# Patient Record
Sex: Female | Born: 1955 | Race: White | Hispanic: No | Marital: Married | Smoking: Never smoker
Health system: Southern US, Community
[De-identification: ages and names within clinical notes are randomized; demographics above are authoritative.]

## PROBLEM LIST (undated history)

## (undated) DIAGNOSIS — G4730 Sleep apnea, unspecified: Secondary | ICD-10-CM

## (undated) DIAGNOSIS — Z9889 Other specified postprocedural states: Secondary | ICD-10-CM

## (undated) DIAGNOSIS — T8859XA Other complications of anesthesia, initial encounter: Secondary | ICD-10-CM

## (undated) DIAGNOSIS — E039 Hypothyroidism, unspecified: Secondary | ICD-10-CM

## (undated) DIAGNOSIS — K5900 Constipation, unspecified: Secondary | ICD-10-CM

## (undated) DIAGNOSIS — Z8719 Personal history of other diseases of the digestive system: Secondary | ICD-10-CM

## (undated) DIAGNOSIS — K219 Gastro-esophageal reflux disease without esophagitis: Secondary | ICD-10-CM

## (undated) DIAGNOSIS — F419 Anxiety disorder, unspecified: Secondary | ICD-10-CM

## (undated) DIAGNOSIS — J189 Pneumonia, unspecified organism: Secondary | ICD-10-CM

## (undated) DIAGNOSIS — J45909 Unspecified asthma, uncomplicated: Secondary | ICD-10-CM

## (undated) DIAGNOSIS — R569 Unspecified convulsions: Secondary | ICD-10-CM

## (undated) DIAGNOSIS — R112 Nausea with vomiting, unspecified: Secondary | ICD-10-CM

## (undated) DIAGNOSIS — F32A Depression, unspecified: Secondary | ICD-10-CM

## (undated) DIAGNOSIS — J449 Chronic obstructive pulmonary disease, unspecified: Secondary | ICD-10-CM

## (undated) DIAGNOSIS — F329 Major depressive disorder, single episode, unspecified: Secondary | ICD-10-CM

## (undated) DIAGNOSIS — M797 Fibromyalgia: Secondary | ICD-10-CM

## (undated) DIAGNOSIS — T4145XA Adverse effect of unspecified anesthetic, initial encounter: Secondary | ICD-10-CM

## (undated) DIAGNOSIS — R0602 Shortness of breath: Secondary | ICD-10-CM

## (undated) DIAGNOSIS — R51 Headache: Secondary | ICD-10-CM

## (undated) DIAGNOSIS — M1990 Unspecified osteoarthritis, unspecified site: Secondary | ICD-10-CM

## (undated) DIAGNOSIS — K279 Peptic ulcer, site unspecified, unspecified as acute or chronic, without hemorrhage or perforation: Secondary | ICD-10-CM

## (undated) HISTORY — PX: JOINT REPLACEMENT: SHX530

## (undated) HISTORY — PX: TUBAL LIGATION: SHX77

## (undated) HISTORY — PX: VAGINAL HYSTERECTOMY: SUR661

## (undated) HISTORY — PX: BREAST SURGERY: SHX581

## (undated) HISTORY — PX: TONSILLECTOMY: SUR1361

## (undated) HISTORY — PX: COLONOSCOPY: SHX174

## (undated) HISTORY — PX: DILATION AND CURETTAGE OF UTERUS: SHX78

## (undated) HISTORY — PX: BACK SURGERY: SHX140

---

## 2011-02-11 DIAGNOSIS — K219 Gastro-esophageal reflux disease without esophagitis: Secondary | ICD-10-CM | POA: Insufficient documentation

## 2011-02-11 DIAGNOSIS — M797 Fibromyalgia: Secondary | ICD-10-CM | POA: Insufficient documentation

## 2011-02-11 DIAGNOSIS — E039 Hypothyroidism, unspecified: Secondary | ICD-10-CM | POA: Insufficient documentation

## 2011-02-11 DIAGNOSIS — F319 Bipolar disorder, unspecified: Secondary | ICD-10-CM | POA: Insufficient documentation

## 2011-09-24 ENCOUNTER — Other Ambulatory Visit (HOSPITAL_COMMUNITY): Payer: Self-pay | Admitting: Orthopedic Surgery

## 2011-09-24 DIAGNOSIS — M25551 Pain in right hip: Secondary | ICD-10-CM

## 2011-09-30 ENCOUNTER — Encounter (HOSPITAL_COMMUNITY)
Admission: RE | Admit: 2011-09-30 | Discharge: 2011-09-30 | Disposition: A | Discharge status: Home / Self Care | Payer: 59 | Source: Ambulatory Visit | Attending: Orthopedic Surgery | Admitting: Orthopedic Surgery

## 2011-09-30 ENCOUNTER — Encounter (HOSPITAL_COMMUNITY): Payer: Self-pay

## 2011-09-30 DIAGNOSIS — M25551 Pain in right hip: Secondary | ICD-10-CM

## 2011-09-30 DIAGNOSIS — M25559 Pain in unspecified hip: Secondary | ICD-10-CM | POA: Insufficient documentation

## 2011-09-30 MED ORDER — TECHNETIUM TC 99M MEDRONATE IV KIT
25.0000 | PACK | Freq: Once | INTRAVENOUS | Status: AC | PRN
  Administered 2011-09-30: 25 via INTRAVENOUS

## 2012-08-25 DIAGNOSIS — R079 Chest pain, unspecified: Secondary | ICD-10-CM | POA: Insufficient documentation

## 2013-02-18 DIAGNOSIS — G4733 Obstructive sleep apnea (adult) (pediatric): Secondary | ICD-10-CM | POA: Insufficient documentation

## 2013-04-17 DIAGNOSIS — J189 Pneumonia, unspecified organism: Secondary | ICD-10-CM

## 2013-04-17 HISTORY — DX: Pneumonia, unspecified organism: J18.9

## 2013-07-09 ENCOUNTER — Encounter (HOSPITAL_COMMUNITY): Payer: Self-pay | Admitting: Pharmacy Technician

## 2013-07-12 NOTE — Pre-Procedure Instructions (Signed)
Jasmine AweConnie Ostrovsky  07/12/2013   Your procedure is scheduled on:  Wednesday July 14, 2013 at 2:55 PM.  Report [facility] Short Stay Entrance "A"  Admitting at 12:55 PM.  Call this number if you have problems the morning of surgery: [phone]   Remember:   Do not eat food or drink liquids after midnight.   Take these medicines the morning of surgery with A SIP OF WATER: Clonazepam (Klonopin), Duloxetine (Cymbalta), Esomeprazole (Nexium), Estradiol (Estrace), Advair inhaler, Lamotrigine (Lamictal), Levothyroxine (Synthroid), Montelukast (Singulair), gabapentin (NEURONTIN) 300 MG capsule if needed:oxyCODONE-acetaminophen (PERCOCET/ROXICET) 5-325 MG per tablet for moderate pain or severe pain, albuterol (PROVENTIL HFA;VENTOLIN HFA) 108 (90 BASE) MCG/ACT inhaler for wheezing or shortness of breath ( bring inhaler with you on the day of your procedure Stop taking Aspirin, MULTIVITAMIN WITH MINERALS and herbal medications. Do not take any NSAIDs ie: Ibuprofen, Advil, Naproxen or any medication containing Aspirin.  Do not wear jewelry, make-up or nail polish.  Do not wear lotions, powders, or perfumes. You may wear deodorant.  Do not shave 48 hours prior to surgery.   Do not bring valuables to the hospital.  [facility] is not responsible for any belongings or valuables.               Contacts, dentures or bridgework may not be worn into surgery.  Leave suitcase in the car. After surgery it may be brought to your room.  For patients admitted to the hospital, discharge time is determined by your treatment team.               Patients discharged the day of surgery will not be allowed to drive home.  Name and phone number of your driver: Family/Friend  Special Instructions: Shower tonight and the morning of your surgery using CHG soap.  Use special wash - you have one bottle of CHG for all showers.  You should use approximately 1/3 of the bottle for each shower.   Please read over the following fact  sheets that you were given: Pain Booklet, Coughing and Deep Breathing, MRSA Information and Surgical Site Infection Prevention  BRING BRACE WITH YOU ON DAY OF PROCEDURE

## 2013-07-12 NOTE — H&P (Signed)
History of Present Illness The patient is a 58 year old female who presents to the practice today for a transition into care. The patient is transitioning into care from another physician Maria Luz, MD) .  Additional reason for visit:  Follow-up backis described as the following: The patient is being followed for their back pain. They are now patient is here to discuss Spinal Cord Stimulator Placement out. Symptoms reported today include: aching and throbbing. The patient states that they are doing well. Current treatment includes: activity modification, pain medications and happy with the trial results. . The following medication has been used for pain control: Hydrocodone. The patient reports their current pain level to be 0 / 10. The patient presents today following 05/21/2013 SCS trial procedure with Dr.Ramos.    Subjective Transcription  Maria Ford is a very pleasant young woman who has had chronic debilitating back, buttock, and bilateral leg pain. She had a previous lumbar surgery about 5-6 years ago. She also had a total hip replacement done, but unfortunately, she continues to have significant back, buttock, and bilateral leg pain. She describes dysesthesias, numbness, and tingling.    I last saw her in 2011 and she was status post her lumbar decompression and discectomy at L5-S1. At that point, she continued to have a poor quality of life. I saw her again in September 2014 and at that point recommended evaluation for a potential spinal cord stimulator. She subsequently saw Dr. Ethelene Hal who did the trial. The patient noted 100% relief of her back, buttock, and leg pain during the trial. As a result, she is now back in to see me for permanent implantation.    Allergies Sulfa Drugs    Social History( Drug/Alcohol Rehab (Previously). no Exercise. Exercises weekly; does running / walking Illicit drug use. yes Living situation. live with spouse Marital status.  married Number of flights of stairs before winded. 2-3 Pain Contract. no Alcohol use. current drinker; drinks wine; only occasionally per week Children. 2 Current work status. disabled Tobacco / smoke exposure. no Drug/Alcohol Rehab (Currently). no Tobacco use. Never smoker. never smoker    Medication History Norco (5-325MG  Tablet, 1 (one) Tablet Oral 1 po tid, Taken starting 05/28/2013) Active. Adderall (5MG  Tablet, Oral) Active. Albuterol Sulfate ((2.5 MG/3ML)0.083% Nebulized Soln, Inhalation) Active. Cymbalta (20MG  Capsule DR Part, Oral) Active. Gabapentin (300MG  Capsule, Oral) Active. MiraLax ( Oral) Active. Multiple Vitamin (1 (one) Oral) Active. Omega 3 (1000MG  Capsule, Oral) Active. Vitamin D (1000UNIT Capsule, 1 (one) Oral) Active. SEROquel (50MG  Tablet, Oral) Active. Treximet (85-500MG  Tablet, Oral) Active. Advair Diskus (500-50MCG/DOSE Aero Pow Br Act, Inhalation) Active. Clorazepate Dipotassium (3.75MG  Tablet, Oral) Active. NexIUM (40MG  Capsule DR, Oral) Active. Furosemide (20MG  Tablet, Oral) Active. LaMICtal (200MG  Tablet, Oral) Active. Levothyroxine Sodium ( Tablet, Oral) Active. Montelukast Sodium (10MG  Tablet, Oral) Active. Doxycycline Hyclate (100MG  Tablet, Oral) Active. Estradiol (2MG  Tablet, Oral) Active. Ambien (10MG  Tablet, Oral as needed) Active. (rare) Medications Reconciled.    Objective Transcription  She is a pleasant woman who appears younger than her stated age. She is alert. She is oriented times three. She has a slight cough as she is recovering from the flu. She has no shortness of breath or chest pain. Abdomen is soft and nontender. No loss of bowel or bladder control. She has back pain with palpation and range of motion. No focal motor deficits, but she does have dysesthesias diffusely in the leg. No real hip, knee, or ankle pain at present.  Previous thoracic MRI - no significant  stenosis or cord compression.  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 This would be a 2-incision surgery. She would be in the hospital overnight and in about 6 weeks I would allow her to return to her activities. The risks, as I have explained them to the patient and her husband, include infection, bleeding, nerve damage, death, stroke, paralysis, migration of the lead, major bleeding, and need for revision surgery. All of their questions were addressed. Once we obtain clearance from her Pulmonologist, Dr. Maryln Ford, and her primary care physician, Dr. Earlean Ford, we will proceed with surgery. Patient and her husband were present for the dictation.

## 2013-07-13 ENCOUNTER — Encounter (HOSPITAL_COMMUNITY)
Admission: RE | Admit: 2013-07-13 | Discharge: 2013-07-13 | Disposition: A | Discharge status: Home / Self Care | Payer: Medicare Other | Source: Ambulatory Visit | Attending: Orthopedic Surgery | Admitting: Orthopedic Surgery

## 2013-07-13 MED ORDER — CEFAZOLIN SODIUM-DEXTROSE 2-3 GM-% IV SOLR: 2.0000 g | INTRAVENOUS | Status: DC

## 2013-07-14 ENCOUNTER — Encounter (HOSPITAL_COMMUNITY): Admission: RE | Payer: Self-pay | Source: Ambulatory Visit

## 2013-07-14 ENCOUNTER — Ambulatory Visit (HOSPITAL_COMMUNITY): Admission: RE | Admit: 2013-07-14 | Payer: Medicare Other | Source: Ambulatory Visit | Admitting: Orthopedic Surgery

## 2013-07-14 SURGERY — INSERTION, SPINAL CORD STIMULATOR, LUMBAR
Anesthesia: General | Site: Back

## 2013-07-28 ENCOUNTER — Other Ambulatory Visit (HOSPITAL_COMMUNITY): Payer: Self-pay | Admitting: *Deleted

## 2013-07-28 ENCOUNTER — Encounter (HOSPITAL_COMMUNITY)
Admission: RE | Admit: 2013-07-28 | Discharge: 2013-07-28 | Disposition: A | Discharge status: Home / Self Care | Payer: Medicare Other | Source: Ambulatory Visit | Attending: Orthopedic Surgery | Admitting: Orthopedic Surgery

## 2013-07-28 ENCOUNTER — Encounter (HOSPITAL_COMMUNITY): Payer: Self-pay

## 2013-07-28 DIAGNOSIS — Z01812 Encounter for preprocedural laboratory examination: Secondary | ICD-10-CM | POA: Insufficient documentation

## 2013-07-28 HISTORY — DX: Gastro-esophageal reflux disease without esophagitis: K21.9

## 2013-07-28 HISTORY — DX: Unspecified asthma, uncomplicated: J45.909

## 2013-07-28 HISTORY — DX: Other specified postprocedural states: Z98.890

## 2013-07-28 HISTORY — DX: Shortness of breath: R06.02

## 2013-07-28 HISTORY — DX: Nausea with vomiting, unspecified: R11.2

## 2013-07-28 HISTORY — DX: Depression, unspecified: F32.A

## 2013-07-28 HISTORY — DX: Pneumonia, unspecified organism: J18.9

## 2013-07-28 HISTORY — DX: Sleep apnea, unspecified: G47.30

## 2013-07-28 HISTORY — DX: Fibromyalgia: M79.7

## 2013-07-28 HISTORY — DX: Anxiety disorder, unspecified: F41.9

## 2013-07-28 HISTORY — DX: Adverse effect of unspecified anesthetic, initial encounter: T41.45XA

## 2013-07-28 HISTORY — DX: Other complications of anesthesia, initial encounter: T88.59XA

## 2013-07-28 HISTORY — DX: Constipation, unspecified: K59.00

## 2013-07-28 HISTORY — DX: Headache: R51

## 2013-07-28 HISTORY — DX: Unspecified osteoarthritis, unspecified site: M19.90

## 2013-07-28 HISTORY — DX: Personal history of other diseases of the digestive system: Z87.19

## 2013-07-28 HISTORY — DX: Chronic obstructive pulmonary disease, unspecified: J44.9

## 2013-07-28 HISTORY — DX: Hypothyroidism, unspecified: E03.9

## 2013-07-28 HISTORY — DX: Unspecified convulsions: R56.9

## 2013-07-28 HISTORY — DX: Major depressive disorder, single episode, unspecified: F32.9

## 2013-07-28 HISTORY — DX: Peptic ulcer, site unspecified, unspecified as acute or chronic, without hemorrhage or perforation: K27.9

## 2013-07-28 LAB — CBC
HCT: 37.1 % (ref 36.0–46.0)
Hemoglobin: 12.4 g/dL (ref 12.0–15.0)
MCH: 30.7 pg (ref 26.0–34.0)
MCHC: 33.4 g/dL (ref 30.0–36.0)
MCV: 91.8 fL (ref 78.0–100.0)
PLATELETS: 388 10*3/uL (ref 150–400)
RBC: 4.04 MIL/uL (ref 3.87–5.11)
RDW: 13.7 % (ref 11.5–15.5)
WBC: 7.9 10*3/uL (ref 4.0–10.5)

## 2013-07-28 LAB — BASIC METABOLIC PANEL
BUN: 16 mg/dL (ref 6–23)
CALCIUM: 9.5 mg/dL (ref 8.4–10.5)
CHLORIDE: 103 meq/L (ref 96–112)
CO2: 26 mEq/L (ref 19–32)
CREATININE: 0.85 mg/dL (ref 0.50–1.10)
GFR, EST AFRICAN AMERICAN: 87 mL/min — AB (ref >90)
GFR, EST NON AFRICAN AMERICAN: 75 mL/min — AB (ref >90)
Glucose, Bld: 93 mg/dL (ref 70–99)
Potassium: 4.2 mEq/L (ref 3.7–5.3)
Sodium: 144 mEq/L (ref 137–147)

## 2013-07-28 LAB — SURGICAL PCR SCREEN
MRSA, PCR: NEGATIVE
STAPHYLOCOCCUS AUREUS: NEGATIVE

## 2013-07-28 NOTE — Pre-Procedure Instructions (Signed)
Maria Ford  07/28/2013   Your procedure is scheduled on:  Friday, July 30, 2013 at 3:00 PM.   [facility] Entrance "A" Admitting Office at 1:00 PM.   Call this number if you have problems the morning of surgery: [phone]   Remember:   Do not eat food or drink liquids after midnight Thursday, 07/29/13.   Take these medicines the morning of surgery with A SIP OF WATER: clonazePAM (KLONOPIN), DULoxetine HCl (CYMBALTA), esomeprazole (NEXIUM), estradiol (ESTRACE), gabapentin (NEURONTIN), lamoTRIgine (LAMICTAL), levothyroxine (SYNTHROID, LEVOTHROID), montelukast (SINGULAIR),Fluticasone-Salmeterol (ADVAIR) inhaler, albuterol (PROVENTIL HFA;VENTOLIN HFA) inhaler if needed (Please bring with you on day of surgery), cyclobenzaprine (FLEXERIL) - if needed, oxyCODONE-acetaminophen (PERCOCET/ROXICET) - if needed.  Please bring your brace with you day of surgery.   Do not wear jewelry, make-up or nail polish.  Do not wear lotions, powders, or perfumes. You may wear deodorant.  Do not shave 48 hours prior to surgery.   Do not bring valuables to the hospital.  [facility] is not responsible                  for any belongings or valuables.               Contacts, dentures or bridgework may not be worn into surgery.  Leave suitcase in the car. After surgery it may be brought to your room.  For patients admitted to the hospital, discharge time is determined by your                treatment team.             Special Instructions: [place] - Preparing for Surgery  Before surgery, you can play an important role.  Because skin is not sterile, your skin needs to be as free of germs as possible.  You can reduce the number of germs on you skin by washing with CHG (chlorahexidine gluconate) soap before surgery.  CHG is an antiseptic cleaner which kills germs and bonds with the skin to continue killing germs even after washing.  Please DO NOT use if you have an allergy to CHG or  antibacterial soaps.  If your skin becomes reddened/irritated stop using the CHG and inform your nurse when you arrive at Short Stay.  Do not shave (including legs and underarms) for at least 48 hours prior to the first CHG shower.  You may shave your face.  Please follow these instructions carefully:   1.  Shower with CHG Soap the night before surgery and the                                morning of Surgery.  2.  If you choose to wash your hair, wash your hair first as usual with your       normal shampoo.  3.  After you shampoo, rinse your hair and body thoroughly to remove the                      Shampoo.  4.  Use CHG as you would any other liquid soap.  You can apply chg directly       to the skin and wash gently with scrungie or a clean washcloth.  5.  Apply the CHG Soap to your body ONLY FROM THE NECK DOWN.        Do not use on open wounds or open  sores.  Avoid contact with your eyes, ears, mouth and genitals (private parts).  Wash genitals (private parts)  with your normal soap.  6.  Wash thoroughly, paying special attention to the area where your surgery        will be performed.  7.  Thoroughly rinse your body with warm water from the neck down.  8.  DO NOT shower/wash with your normal soap after using and rinsing off       the CHG Soap.  9.  Pat yourself dry with a clean towel.            10.  Wear clean pajamas.            11.  Place clean sheets on your bed the night of your first shower and do not        sleep with pets.  Day of Surgery  Do not apply any lotions the morning of surgery.  Please wear clean clothes to the hospital/surgery center.     Please read over the following fact sheets that you were given: Pain Booklet, Coughing and Deep Breathing, MRSA Information and Surgical Site Infection Prevention

## 2013-07-29 NOTE — Progress Notes (Signed)
Call to [facility], [place],  Requested that they send records. They were sent yesterday per staff in Health info., release, records not seen thus far.

## 2013-07-30 ENCOUNTER — Ambulatory Visit (HOSPITAL_COMMUNITY): Payer: Medicare Other

## 2013-07-30 ENCOUNTER — Ambulatory Visit (HOSPITAL_COMMUNITY): Payer: Medicare Other | Admitting: Anesthesiology

## 2013-07-30 ENCOUNTER — Observation Stay (HOSPITAL_COMMUNITY)
Admission: RE | Admit: 2013-07-30 | Discharge: 2013-07-31 | Disposition: A | Discharge status: Home / Self Care | Payer: Medicare Other | Source: Ambulatory Visit | Attending: Orthopedic Surgery | Admitting: Orthopedic Surgery

## 2013-07-30 ENCOUNTER — Encounter (HOSPITAL_COMMUNITY): Payer: Self-pay | Admitting: *Deleted

## 2013-07-30 ENCOUNTER — Encounter (HOSPITAL_COMMUNITY): Payer: Medicare Other | Admitting: Anesthesiology

## 2013-07-30 ENCOUNTER — Encounter (HOSPITAL_COMMUNITY)
Admission: RE | Disposition: A | Discharge status: Home / Self Care | Payer: Medicare Other | Source: Ambulatory Visit | Attending: Orthopedic Surgery

## 2013-07-30 DIAGNOSIS — G4730 Sleep apnea, unspecified: Secondary | ICD-10-CM | POA: Insufficient documentation

## 2013-07-30 DIAGNOSIS — E039 Hypothyroidism, unspecified: Secondary | ICD-10-CM | POA: Insufficient documentation

## 2013-07-30 DIAGNOSIS — F411 Generalized anxiety disorder: Secondary | ICD-10-CM | POA: Insufficient documentation

## 2013-07-30 DIAGNOSIS — J449 Chronic obstructive pulmonary disease, unspecified: Secondary | ICD-10-CM | POA: Insufficient documentation

## 2013-07-30 DIAGNOSIS — R569 Unspecified convulsions: Secondary | ICD-10-CM | POA: Insufficient documentation

## 2013-07-30 DIAGNOSIS — K219 Gastro-esophageal reflux disease without esophagitis: Secondary | ICD-10-CM | POA: Insufficient documentation

## 2013-07-30 DIAGNOSIS — J4489 Other specified chronic obstructive pulmonary disease: Secondary | ICD-10-CM | POA: Insufficient documentation

## 2013-07-30 DIAGNOSIS — G8929 Other chronic pain: Secondary | ICD-10-CM

## 2013-07-30 DIAGNOSIS — F329 Major depressive disorder, single episode, unspecified: Secondary | ICD-10-CM | POA: Diagnosis not present

## 2013-07-30 DIAGNOSIS — G894 Chronic pain syndrome: Secondary | ICD-10-CM | POA: Diagnosis not present

## 2013-07-30 DIAGNOSIS — F3289 Other specified depressive episodes: Secondary | ICD-10-CM | POA: Diagnosis not present

## 2013-07-30 DIAGNOSIS — IMO0001 Reserved for inherently not codable concepts without codable children: Secondary | ICD-10-CM | POA: Insufficient documentation

## 2013-07-30 DIAGNOSIS — Z01812 Encounter for preprocedural laboratory examination: Secondary | ICD-10-CM | POA: Insufficient documentation

## 2013-07-30 HISTORY — PX: SPINAL CORD STIMULATOR INSERTION: SHX5378

## 2013-07-30 SURGERY — INSERTION, SPINAL CORD STIMULATOR, LUMBAR
Anesthesia: General | Site: Spine Thoracic

## 2013-07-30 MED ORDER — ROCURONIUM BROMIDE 50 MG/5ML IV SOLN
INTRAVENOUS | Status: AC
  Filled 2013-07-30: qty 1

## 2013-07-30 MED ORDER — METHOCARBAMOL 500 MG PO TABS: 500.0000 mg | ORAL_TABLET | Freq: Four times a day (QID) | ORAL | Status: AC

## 2013-07-30 MED ORDER — PROMETHAZINE HCL 25 MG/ML IJ SOLN: 6.2500 mg | INTRAMUSCULAR | Status: DC | PRN

## 2013-07-30 MED ORDER — MIDAZOLAM HCL 2 MG/2ML IJ SOLN
INTRAMUSCULAR | Status: AC
  Filled 2013-07-30: qty 2

## 2013-07-30 MED ORDER — HYDROMORPHONE HCL PF 1 MG/ML IJ SOLN
0.2500 mg | INTRAMUSCULAR | Status: DC | PRN
  Administered 2013-07-30 (×2): 0.5 mg via INTRAVENOUS

## 2013-07-30 MED ORDER — 0.9 % SODIUM CHLORIDE (POUR BTL) OPTIME
TOPICAL | Status: DC | PRN
  Administered 2013-07-30: 1000 mL

## 2013-07-30 MED ORDER — PHENOL 1.4 % MT LIQD: 1.0000 | OROMUCOSAL | Status: DC | PRN

## 2013-07-30 MED ORDER — POLYETHYLENE GLYCOL 3350 17 G PO PACK: 17.0000 g | PACK | Freq: Every day | ORAL | Status: DC

## 2013-07-30 MED ORDER — SODIUM CHLORIDE 0.9 % IV SOLN: 250.0000 mL | INTRAVENOUS | Status: DC

## 2013-07-30 MED ORDER — LIDOCAINE HCL (CARDIAC) 20 MG/ML IV SOLN
INTRAVENOUS | Status: DC | PRN
  Administered 2013-07-30: 100 mg via INTRAVENOUS

## 2013-07-30 MED ORDER — HYDROMORPHONE HCL PF 1 MG/ML IJ SOLN
INTRAMUSCULAR | Status: AC
  Filled 2013-07-30: qty 1

## 2013-07-30 MED ORDER — KETAMINE HCL 100 MG/ML IJ SOLN
INTRAMUSCULAR | Status: AC
  Filled 2013-07-30: qty 1

## 2013-07-30 MED ORDER — CEFAZOLIN SODIUM-DEXTROSE 2-3 GM-% IV SOLR
INTRAVENOUS | Status: DC | PRN
  Administered 2013-07-30: 2 g via INTRAVENOUS

## 2013-07-30 MED ORDER — GLYCOPYRROLATE 0.2 MG/ML IJ SOLN
INTRAMUSCULAR | Status: DC | PRN
  Administered 2013-07-30: .4 mg via INTRAVENOUS

## 2013-07-30 MED ORDER — ACETAMINOPHEN 10 MG/ML IV SOLN
1000.0000 mg | Freq: Four times a day (QID) | INTRAVENOUS | Status: DC
  Administered 2013-07-30: 1000 mg via INTRAVENOUS
  Filled 2013-07-30 (×4): qty 100

## 2013-07-30 MED ORDER — ONDANSETRON HCL 4 MG/2ML IJ SOLN: 4.0000 mg | INTRAMUSCULAR | Status: DC | PRN

## 2013-07-30 MED ORDER — PROPOFOL 10 MG/ML IV BOLUS
INTRAVENOUS | Status: AC
  Filled 2013-07-30: qty 20

## 2013-07-30 MED ORDER — ARTIFICIAL TEARS OP OINT
TOPICAL_OINTMENT | OPHTHALMIC | Status: AC
  Filled 2013-07-30: qty 3.5

## 2013-07-30 MED ORDER — QUETIAPINE FUMARATE 50 MG PO TABS
50.0000 mg | ORAL_TABLET | Freq: Every day | ORAL | Status: DC
  Administered 2013-07-30: 50 mg via ORAL
  Filled 2013-07-30 (×2): qty 1

## 2013-07-30 MED ORDER — CLONAZEPAM 0.5 MG PO TABS
0.5000 mg | ORAL_TABLET | Freq: Three times a day (TID) | ORAL | Status: DC
  Administered 2013-07-30 – 2013-07-31 (×2): 0.5 mg via ORAL
  Filled 2013-07-30 (×2): qty 1

## 2013-07-30 MED ORDER — ACETAMINOPHEN 10 MG/ML IV SOLN
1000.0000 mg | Freq: Four times a day (QID) | INTRAVENOUS | Status: DC
  Administered 2013-07-30: 1000 mg via INTRAVENOUS

## 2013-07-30 MED ORDER — ONDANSETRON HCL 4 MG/2ML IJ SOLN
INTRAMUSCULAR | Status: DC | PRN
  Administered 2013-07-30: 4 mg via INTRAVENOUS

## 2013-07-30 MED ORDER — LIDOCAINE HCL (CARDIAC) 20 MG/ML IV SOLN
INTRAVENOUS | Status: AC
  Filled 2013-07-30: qty 5

## 2013-07-30 MED ORDER — ALBUTEROL SULFATE HFA 108 (90 BASE) MCG/ACT IN AERS: 2.0000 | INHALATION_SPRAY | Freq: Four times a day (QID) | RESPIRATORY_TRACT | Status: DC | PRN

## 2013-07-30 MED ORDER — ARTIFICIAL TEARS OP OINT
TOPICAL_OINTMENT | OPHTHALMIC | Status: DC | PRN
  Administered 2013-07-30: 1 via OPHTHALMIC

## 2013-07-30 MED ORDER — LACTATED RINGERS IV SOLN
INTRAVENOUS | Status: DC | PRN
  Administered 2013-07-30 (×2): via INTRAVENOUS

## 2013-07-30 MED ORDER — OXYCODONE HCL 5 MG PO TABS
ORAL_TABLET | ORAL | Status: AC
  Filled 2013-07-30: qty 2

## 2013-07-30 MED ORDER — LACTATED RINGERS IV SOLN
INTRAVENOUS | Status: DC
Start: 2013-07-30 — End: 2013-07-31
  Administered 2013-07-30: 14:00:00 via INTRAVENOUS

## 2013-07-30 MED ORDER — LACTATED RINGERS IV SOLN: INTRAVENOUS | Status: DC

## 2013-07-30 MED ORDER — KETAMINE HCL 50 MG/ML IJ SOLN
INTRAMUSCULAR | Status: DC | PRN
  Administered 2013-07-30: 45 mg via INTRAMUSCULAR

## 2013-07-30 MED ORDER — OXYCODONE-ACETAMINOPHEN 5-325 MG PO TABS
ORAL_TABLET | ORAL | Status: AC
  Filled 2013-07-30: qty 1

## 2013-07-30 MED ORDER — THROMBIN 20000 UNITS EX SOLR
CUTANEOUS | Status: AC
  Filled 2013-07-30: qty 20000

## 2013-07-30 MED ORDER — BUPIVACAINE-EPINEPHRINE 0.25% -1:200000 IJ SOLN
INTRAMUSCULAR | Status: DC | PRN
  Administered 2013-07-30: 17 mL

## 2013-07-30 MED ORDER — MONTELUKAST SODIUM 10 MG PO TABS
10.0000 mg | ORAL_TABLET | Freq: Every day | ORAL | Status: DC
  Administered 2013-07-31: 10 mg via ORAL
  Filled 2013-07-30: qty 1

## 2013-07-30 MED ORDER — SODIUM CHLORIDE 0.9 % IJ SOLN
INTRAMUSCULAR | Status: AC
Start: 2013-07-30 — End: 2013-07-30
  Filled 2013-07-30: qty 10

## 2013-07-30 MED ORDER — ONDANSETRON 4 MG PO TBDP: 4.0000 mg | ORAL_TABLET | Freq: Three times a day (TID) | ORAL | Status: AC | PRN

## 2013-07-30 MED ORDER — MOMETASONE FURO-FORMOTEROL FUM 200-5 MCG/ACT IN AERO
2.0000 | INHALATION_SPRAY | Freq: Two times a day (BID) | RESPIRATORY_TRACT | Status: DC
  Filled 2013-07-30: qty 8.8

## 2013-07-30 MED ORDER — OXYCODONE-ACETAMINOPHEN 5-325 MG PO TABS: 1.0000 | ORAL_TABLET | Freq: Four times a day (QID) | ORAL | Status: AC | PRN

## 2013-07-30 MED ORDER — KETOROLAC TROMETHAMINE 30 MG/ML IJ SOLN
INTRAMUSCULAR | Status: AC
  Filled 2013-07-30: qty 1

## 2013-07-30 MED ORDER — DULOXETINE HCL 20 MG PO CPEP
40.0000 mg | ORAL_CAPSULE | Freq: Every day | ORAL | Status: DC
  Administered 2013-07-31: 40 mg via ORAL
  Filled 2013-07-30: qty 2

## 2013-07-30 MED ORDER — ROCURONIUM BROMIDE 100 MG/10ML IV SOLN
INTRAVENOUS | Status: DC | PRN
  Administered 2013-07-30: 10 mg via INTRAVENOUS
  Administered 2013-07-30: 50 mg via INTRAVENOUS

## 2013-07-30 MED ORDER — BUPIVACAINE-EPINEPHRINE (PF) 0.25% -1:200000 IJ SOLN
INTRAMUSCULAR | Status: AC
  Filled 2013-07-30: qty 30

## 2013-07-30 MED ORDER — MIDAZOLAM HCL 5 MG/5ML IJ SOLN
INTRAMUSCULAR | Status: DC | PRN
  Administered 2013-07-30: 2 mg via INTRAVENOUS

## 2013-07-30 MED ORDER — HEMOSTATIC AGENTS (NO CHARGE) OPTIME
TOPICAL | Status: DC | PRN
  Administered 2013-07-30: 1 via TOPICAL

## 2013-07-30 MED ORDER — ACETAMINOPHEN 160 MG/5ML PO SOLN
325.0000 mg | ORAL | Status: DC | PRN
  Filled 2013-07-30: qty 20.3

## 2013-07-30 MED ORDER — MENTHOL 3 MG MT LOZG: 1.0000 | LOZENGE | OROMUCOSAL | Status: DC | PRN

## 2013-07-30 MED ORDER — SODIUM CHLORIDE 0.9 % IV SOLN
250.0000 mg | INTRAVENOUS | Status: DC | PRN
  Administered 2013-07-30: 5 ug/kg/min via INTRAVENOUS

## 2013-07-30 MED ORDER — CLORAZEPATE DIPOTASSIUM 3.75 MG PO TABS
7.5000 mg | ORAL_TABLET | Freq: Two times a day (BID) | ORAL | Status: DC | PRN
Start: 2013-07-30 — End: 2013-07-31

## 2013-07-30 MED ORDER — NEOSTIGMINE METHYLSULFATE 1 MG/ML IJ SOLN
INTRAMUSCULAR | Status: AC
  Filled 2013-07-30: qty 10

## 2013-07-30 MED ORDER — DEXAMETHASONE SODIUM PHOSPHATE 4 MG/ML IJ SOLN: 4.0000 mg | Freq: Once | INTRAMUSCULAR | Status: DC

## 2013-07-30 MED ORDER — LEVOTHYROXINE SODIUM 50 MCG PO TABS
50.0000 ug | ORAL_TABLET | Freq: Every day | ORAL | Status: DC
Start: 2013-07-31 — End: 2013-07-31
  Administered 2013-07-31: 50 ug via ORAL
  Filled 2013-07-30 (×2): qty 1

## 2013-07-30 MED ORDER — THROMBIN 20000 UNITS EX SOLR
CUTANEOUS | Status: DC | PRN
  Administered 2013-07-30: 18:00:00 via TOPICAL

## 2013-07-30 MED ORDER — DEXAMETHASONE SODIUM PHOSPHATE 4 MG/ML IJ SOLN
4.0000 mg | Freq: Four times a day (QID) | INTRAMUSCULAR | Status: DC
  Filled 2013-07-30 (×7): qty 1

## 2013-07-30 MED ORDER — DOCUSATE SODIUM 100 MG PO CAPS: 100.0000 mg | ORAL_CAPSULE | Freq: Two times a day (BID) | ORAL | Status: AC

## 2013-07-30 MED ORDER — OXYCODONE-ACETAMINOPHEN 5-325 MG PO TABS
1.0000 | ORAL_TABLET | Freq: Once | ORAL | Status: AC
  Administered 2013-07-30: 1 via ORAL

## 2013-07-30 MED ORDER — ACETAMINOPHEN 325 MG PO TABS: 325.0000 mg | ORAL_TABLET | ORAL | Status: DC | PRN

## 2013-07-30 MED ORDER — SUFENTANIL CITRATE 50 MCG/ML IV SOLN
INTRAVENOUS | Status: AC
  Filled 2013-07-30: qty 1

## 2013-07-30 MED ORDER — ESTRADIOL 2 MG PO TABS
2.0000 mg | ORAL_TABLET | Freq: Every day | ORAL | Status: DC
  Administered 2013-07-31: 2 mg via ORAL
  Filled 2013-07-30: qty 1

## 2013-07-30 MED ORDER — SODIUM CHLORIDE 0.9 % IJ SOLN: 3.0000 mL | INTRAMUSCULAR | Status: DC | PRN

## 2013-07-30 MED ORDER — METHOCARBAMOL 500 MG PO TABS
500.0000 mg | ORAL_TABLET | Freq: Four times a day (QID) | ORAL | Status: DC | PRN
  Administered 2013-07-30 – 2013-07-31 (×2): 500 mg via ORAL
  Filled 2013-07-30: qty 1

## 2013-07-30 MED ORDER — DEXAMETHASONE 4 MG PO TABS
4.0000 mg | ORAL_TABLET | Freq: Four times a day (QID) | ORAL | Status: DC
  Administered 2013-07-30 – 2013-07-31 (×3): 4 mg via ORAL
  Filled 2013-07-30 (×7): qty 1

## 2013-07-30 MED ORDER — KETOROLAC TROMETHAMINE 30 MG/ML IJ SOLN
15.0000 mg | Freq: Once | INTRAMUSCULAR | Status: AC | PRN
  Administered 2013-07-30: 30 mg via INTRAVENOUS

## 2013-07-30 MED ORDER — PROPOFOL 10 MG/ML IV BOLUS
INTRAVENOUS | Status: DC | PRN
  Administered 2013-07-30: 200 mg via INTRAVENOUS

## 2013-07-30 MED ORDER — DEXAMETHASONE SODIUM PHOSPHATE 4 MG/ML IJ SOLN
INTRAMUSCULAR | Status: AC
  Administered 2013-07-30: 4 mg via INTRAVENOUS
  Filled 2013-07-30: qty 1

## 2013-07-30 MED ORDER — ONDANSETRON HCL 4 MG/2ML IJ SOLN
INTRAMUSCULAR | Status: AC
  Filled 2013-07-30: qty 2

## 2013-07-30 MED ORDER — METHOCARBAMOL 500 MG PO TABS
ORAL_TABLET | ORAL | Status: AC
  Filled 2013-07-30: qty 1

## 2013-07-30 MED ORDER — CEFAZOLIN SODIUM 1-5 GM-% IV SOLN
1.0000 g | Freq: Three times a day (TID) | INTRAVENOUS | Status: DC
  Administered 2013-07-30: 1 g via INTRAVENOUS
  Filled 2013-07-30 (×2): qty 50

## 2013-07-30 MED ORDER — MORPHINE SULFATE 2 MG/ML IJ SOLN
1.0000 mg | INTRAMUSCULAR | Status: DC | PRN
  Administered 2013-07-30: 2 mg via INTRAVENOUS
  Filled 2013-07-30: qty 1

## 2013-07-30 MED ORDER — GABAPENTIN 300 MG PO CAPS
300.0000 mg | ORAL_CAPSULE | Freq: Four times a day (QID) | ORAL | Status: DC
  Administered 2013-07-30 – 2013-07-31 (×2): 300 mg via ORAL
  Filled 2013-07-30 (×5): qty 1

## 2013-07-30 MED ORDER — OXYCODONE HCL 5 MG PO TABS
10.0000 mg | ORAL_TABLET | ORAL | Status: DC | PRN
  Administered 2013-07-30 – 2013-07-31 (×4): 10 mg via ORAL
  Filled 2013-07-30 (×3): qty 2

## 2013-07-30 MED ORDER — NEOSTIGMINE METHYLSULFATE 1 MG/ML IJ SOLN
INTRAMUSCULAR | Status: DC | PRN
  Administered 2013-07-30: 3 mg via INTRAVENOUS

## 2013-07-30 MED ORDER — SODIUM CHLORIDE 0.9 % IJ SOLN: 3.0000 mL | Freq: Two times a day (BID) | INTRAMUSCULAR | Status: DC

## 2013-07-30 MED ORDER — METHOCARBAMOL 100 MG/ML IJ SOLN
500.0000 mg | Freq: Four times a day (QID) | INTRAVENOUS | Status: DC | PRN
  Filled 2013-07-30: qty 5

## 2013-07-30 MED ORDER — GLYCOPYRROLATE 0.2 MG/ML IJ SOLN
INTRAMUSCULAR | Status: AC
  Filled 2013-07-30: qty 2

## 2013-07-30 MED ORDER — SUFENTANIL CITRATE 50 MCG/ML IV SOLN
INTRAVENOUS | Status: DC | PRN
  Administered 2013-07-30: 10 ug via INTRAVENOUS
  Administered 2013-07-30: 20 ug via INTRAVENOUS
  Administered 2013-07-30 (×2): 10 ug via INTRAVENOUS

## 2013-07-30 SURGICAL SUPPLY — 60 items
CANISTER SUCTION 2500CC (MISCELLANEOUS) ×2 IMPLANT
CLOTH BEACON ORANGE TIMEOUT ST (SAFETY) ×2 IMPLANT
CLSR STERI-STRIP ANTIMIC 1/2X4 (GAUZE/BANDAGES/DRESSINGS) ×4 IMPLANT
CORDS BIPOLAR (ELECTRODE) ×2 IMPLANT
COVER PROBE W GEL 5X96 (DRAPES) ×2 IMPLANT
DRAPE C-ARM 42X72 X-RAY (DRAPES) ×2 IMPLANT
DRAPE INCISE IOBAN 85X60 (DRAPES) ×2 IMPLANT
DRAPE SURG 17X23 STRL (DRAPES) ×2 IMPLANT
DRAPE U-SHAPE 47X51 STRL (DRAPES) ×2 IMPLANT
DRSG MEPILEX BORDER 4X4 (GAUZE/BANDAGES/DRESSINGS) ×2 IMPLANT
DRSG MEPILEX BORDER 4X8 (GAUZE/BANDAGES/DRESSINGS) ×4 IMPLANT
DURAPREP 26ML APPLICATOR (WOUND CARE) ×2 IMPLANT
ELECT BLADE 4.0 EZ CLEAN MEGAD (MISCELLANEOUS) ×2
ELECT CAUTERY BLADE 6.4 (BLADE) ×2 IMPLANT
ELECT REM PT RETURN 9FT ADLT (ELECTROSURGICAL) ×2
ELECTRODE BLDE 4.0 EZ CLN MEGD (MISCELLANEOUS) ×1 IMPLANT
ELECTRODE REM PT RTRN 9FT ADLT (ELECTROSURGICAL) ×1 IMPLANT
GLOVE BIOGEL PI IND STRL 8 (GLOVE) ×1 IMPLANT
GLOVE BIOGEL PI IND STRL 8.5 (GLOVE) ×1 IMPLANT
GLOVE BIOGEL PI INDICATOR 8 (GLOVE) ×1
GLOVE BIOGEL PI INDICATOR 8.5 (GLOVE) ×1
GLOVE ECLIPSE 8.5 STRL (GLOVE) ×4 IMPLANT
GLOVE ORTHO TXT STRL SZ7.5 (GLOVE) ×2 IMPLANT
GOWN STRL REIN 2XL XLG LVL4 (GOWN DISPOSABLE) ×2 IMPLANT
GOWN STRL REIN XL XLG (GOWN DISPOSABLE) ×4 IMPLANT
GOWN STRL REUS W/TWL 2XL LVL3 (GOWN DISPOSABLE) ×2 IMPLANT
KIT BASIN OR (CUSTOM PROCEDURE TRAY) ×2 IMPLANT
KIT ROOM TURNOVER OR (KITS) ×2 IMPLANT
LAMI NARROW PRIPOLE 16CH (Neuro Prosthesis/Implant) ×2 IMPLANT
NDL SUT 6 .5 CRC .975X.05 MAYO (NEEDLE) ×1 IMPLANT
NEEDLE 22X1 1/2 (OR ONLY) (NEEDLE) ×2 IMPLANT
NEEDLE MAYO TAPER (NEEDLE) ×1
NEEDLE SPNL 18GX3.5 QUINCKE PK (NEEDLE) ×6 IMPLANT
NS IRRIG 1000ML POUR BTL (IV SOLUTION) ×2 IMPLANT
PACK LAMINECTOMY ORTHO (CUSTOM PROCEDURE TRAY) ×2 IMPLANT
PACK UNIVERSAL I (CUSTOM PROCEDURE TRAY) ×2 IMPLANT
PAD ARMBOARD 7.5X6 YLW CONV (MISCELLANEOUS) ×4 IMPLANT
PROGRAMMER PATIENT (MISCELLANEOUS) ×2 IMPLANT
SPONGE LAP 4X18 X RAY DECT (DISPOSABLE) IMPLANT
SPONGE SURGIFOAM ABS GEL 100 (HEMOSTASIS) ×2 IMPLANT
STAPLER VISISTAT 35W (STAPLE) ×2 IMPLANT
STIMULATOR IPG PROTEGE SPINAL (Stimulator) ×2 IMPLANT
SURGIFLO TRUKIT (HEMOSTASIS) ×2 IMPLANT
SUT FIBERWIRE #2 38 REV NDL BL (SUTURE) ×2
SUT MON AB 3-0 SH 27 (SUTURE) ×2
SUT MON AB 3-0 SH27 (SUTURE) ×2 IMPLANT
SUT VIC AB 1 CT1 18XCR BRD 8 (SUTURE) ×1 IMPLANT
SUT VIC AB 1 CT1 27 (SUTURE) ×3
SUT VIC AB 1 CT1 27XBRD ANBCTR (SUTURE) ×3 IMPLANT
SUT VIC AB 1 CT1 8-18 (SUTURE) ×1
SUT VIC AB 2-0 CT1 18 (SUTURE) ×4 IMPLANT
SUTURE FIBERWR#2 38 REV NDL BL (SUTURE) ×1 IMPLANT
SYR BULB IRRIGATION 50ML (SYRINGE) ×4 IMPLANT
SYR CONTROL 10ML LL (SYRINGE) ×2 IMPLANT
SYSTEM CHARGING PRODIGY (MISCELLANEOUS) ×2 IMPLANT
TOWEL OR 17X24 6PK STRL BLUE (TOWEL DISPOSABLE) ×2 IMPLANT
TOWEL OR 17X26 10 PK STRL BLUE (TOWEL DISPOSABLE) ×2 IMPLANT
TRAY FOLEY CATH 16FRSI W/METER (SET/KITS/TRAYS/PACK) IMPLANT
WATER STERILE IRR 1000ML POUR (IV SOLUTION) ×2 IMPLANT
YANKAUER SUCT BULB TIP NO VENT (SUCTIONS) ×2 IMPLANT

## 2013-07-30 NOTE — Transfer of Care (Signed)
Immediate Anesthesia Transfer of Care Note  Patient: Maria Ford  Procedure(s) Performed: Procedure(s): LUMBAR SPINAL CORD STIMULATOR INSERTION (N/A)  Patient Location: PACU  Anesthesia Type:General  Level of Consciousness: awake, alert , oriented and patient cooperative  Airway & Oxygen Therapy: Patient Spontanous Breathing and Patient connected to nasal cannula oxygen  Post-op Assessment: Report given to PACU RN, Post -op Vital signs reviewed and stable and Patient moving all extremities  Post vital signs: Reviewed and stable  Complications: No apparent anesthesia complications

## 2013-07-30 NOTE — Anesthesia Procedure Notes (Signed)
Procedure Name: Intubation Date/Time: 07/30/2013 3:53 PM Performed by: Coralee RudFLORES, Abcde Oneil Pre-anesthesia Checklist: Patient identified, Emergency Drugs available, Suction available and Patient being monitored Patient Re-evaluated:Patient Re-evaluated prior to inductionOxygen Delivery Method: Circle system utilized Preoxygenation: Pre-oxygenation with 100% oxygen Intubation Type: IV induction Ventilation: Mask ventilation without difficulty Laryngoscope Size: Miller and 3 Grade View: Grade I Tube type: Oral Tube size: 7.5 mm Number of attempts: 1 Airway Equipment and Method: Stylet Placement Confirmation: ETT inserted through vocal cords under direct vision and positive ETCO2 Secured at: 22 cm Tube secured with: Tape Dental Injury: Teeth and Oropharynx as per pre-operative assessment

## 2013-07-30 NOTE — Anesthesia Preprocedure Evaluation (Signed)
Anesthesia Evaluation    Airway Mallampati: I TM Distance: >3 FB Neck ROM: Full    Dental  (+) Teeth Intact   Pulmonary neg shortness of breath, asthma , sleep apnea and Continuous Positive Airway Pressure Ventilation , pneumonia -, resolved,    Pulmonary exam normal       Cardiovascular - angina- CAD, - CABG and - DOE - Valvular Problems/MurmursRhythm:Regular Rate:Normal     Neuro/Psych  Headaches, Seizures -,  Anxiety Depression  Neuromuscular disease    GI/Hepatic hiatal hernia, PUD, GERD-  Medicated and Controlled,  Endo/Other  Hypothyroidism   Renal/GU      Musculoskeletal  (+) Fibromyalgia -  Abdominal   Peds  Hematology negative hematology ROS (+)   Anesthesia Other Findings   Reproductive/Obstetrics                           Anesthesia Physical Anesthesia Plan  ASA: III  Anesthesia Plan: General   Post-op Pain Management:    Induction: Intravenous  Airway Management Planned: Oral ETT  Additional Equipment: None  Intra-op Plan:   Post-operative Plan: Extubation in OR  Informed Consent: I have reviewed the patients History and Physical, chart, labs and discussed the procedure including the risks, benefits and alternatives for the proposed anesthesia with the patient or authorized representative who has indicated his/her understanding and acceptance.   Dental advisory given  Plan Discussed with: CRNA and Surgeon  Anesthesia Plan Comments:         Anesthesia Quick Evaluation

## 2013-07-30 NOTE — Brief Op Note (Signed)
07/30/2013  6:02 PM  PATIENT:  Maria Ford  58 y.o. female  PRE-OPERATIVE DIAGNOSIS:  CHRONIC PAIN  POST-OPERATIVE DIAGNOSIS:  CHRONIC PAIN  PROCEDURE:  Procedure(s): LUMBAR SPINAL CORD STIMULATOR INSERTION (N/A)  SURGEON:  Surgeon(s) and Role:    * Venita Lickahari Avri Paiva, MD - Primary  PHYSICIAN ASSISTANT:   ASSISTANTS: none   ANESTHESIA:   general  EBL:  Total I/O In: 1750 [I.V.:1750] Out: -   BLOOD ADMINISTERED:none  DRAINS: none   LOCAL MEDICATIONS USED:  MARCAINE     SPECIMEN:  No Specimen  DISPOSITION OF SPECIMEN:  N/A  COUNTS:  YES  TOURNIQUET:  * No tourniquets in log *  DICTATION: .Other Dictation: Dictation Number [id number]  PLAN OF CARE: Admit for overnight observation  PATIENT DISPOSITION:  PACU - hemodynamically stable.

## 2013-07-30 NOTE — H&P (Signed)
No change in clinical exam H+P reviewed  

## 2013-07-30 NOTE — Anesthesia Postprocedure Evaluation (Signed)
Anesthesia Post Note  Patient: Maria Ford  Procedure(s) Performed: Procedure(s) (LRB): LUMBAR SPINAL CORD STIMULATOR INSERTION (N/A)  Anesthesia type: general  Patient location: PACU  Post pain: Pain level controlled  Post assessment: Patient's Cardiovascular Status Stable  Last Vitals:  Filed Vitals:   07/30/13 1945  BP:   Pulse: 73  Temp:   Resp: 14    Post vital signs: Reviewed and stable  Level of consciousness: sedated  Complications: No apparent anesthesia complications

## 2013-07-31 MED ORDER — ACETAMINOPHEN 500 MG PO TABS
1000.0000 mg | ORAL_TABLET | Freq: Four times a day (QID) | ORAL | Status: DC | PRN
  Administered 2013-07-31: 1000 mg via ORAL

## 2013-07-31 NOTE — Evaluation (Signed)
Physical Therapy Evaluation Patient Details Name: Maria Ford MRN: [id number] DOB: [birth year] Today's Date: 07/31/2013 Time: [id number] PT Time Calculation (min): 13 min  PT Assessment / Plan / Recommendation History of Present Illness  LUMBAR SPINAL CORD STIMULATOR INSERTION   Clinical Impression  Pt is mod I with all mobility and gait, no further acute PT needs identified at this time.    PT Assessment  Patent does not need any further PT services    Follow Up Recommendations  No PT follow up    Does the patient have the potential to tolerate intense rehabilitation      Barriers to Discharge        Equipment Recommendations  None recommended by PT    Recommendations for Other Services     Frequency      Precautions / Restrictions Precautions Precautions: None Precaution Booklet Issued: Yes (comment) Precaution Comments: Made pt aware that it is good to follow back precautions whenever anyone has back issues Restrictions Weight Bearing Restrictions: No   Pertinent Vitals/Pain Pt says "It still hurts, but it's better"  Spinal stimulater is active      Mobility  Bed Mobility Overal bed mobility: Modified Independent Transfers Overall transfer level: Modified independent Ambulation/Gait Ambulation/Gait assistance: Modified independent (Device/Increase time) Ambulation Distance (Feet): 150 Feet Assistive device: None Gait velocity interpretation: Below normal speed for age/gender Stairs: Yes Stairs assistance: Modified independent (Device/Increase time) Stair Management: Two rails Number of Stairs: 5 General stair comments: no LOB    Exercises     PT Diagnosis:    PT Problem List:   PT Treatment Interventions:       PT Goals(Current goals can be found in the care plan section) Acute Rehab PT Goals Patient Stated Goal: Home today PT Goal Formulation: No goals set, d/c therapy  Visit Information  Last PT Received On: 07/31/13 Assistance Needed:  +1 History of Present Illness: LUMBAR SPINAL CORD STIMULATOR INSERTION        Prior Functioning  Home Living Family/patient expects to be discharged to:: Private residence Living Arrangements: Spouse/significant other Available Help at Discharge: Family Type of Home: House Home Access: Stairs to enter Secretary/administratorntrance Stairs-Number of Steps: 4 Entrance Stairs-Rails: Can reach both Home Layout: One level Home Equipment: Hand held shower head Prior Function Level of Independence: Independent Communication Communication: No difficulties Dominant Hand: Right    Cognition  Cognition Arousal/Alertness: Awake/alert Behavior During Therapy: WFL for tasks assessed/performed Overall Cognitive Status: Within Functional Limits for tasks assessed    Extremity/Trunk Assessment Upper Extremity Assessment Upper Extremity Assessment: Overall WFL for tasks assessed Lower Extremity Assessment Lower Extremity Assessment: Generalized weakness Cervical / Trunk Assessment Cervical / Trunk Assessment:  (decreased AROM due to pain)   Balance    End of Session PT - End of Session Activity Tolerance: Patient tolerated treatment well Patient left: in bed;with call bell/phone within reach Nurse Communication: Mobility status  GP Functional Assessment Tool Used: clinical judgement Functional Limitation: Mobility: Walking and moving around Mobility: Walking and Moving Around Current Status ([phone]): At least 1 percent but less than 20 percent impaired, limited or restricted Mobility: Walking and Moving Around Goal Status [phone]): At least 1 percent but less than 20 percent impaired, limited or restricted Mobility: Walking and Moving Around Discharge Status [phone]): At least 1 percent but less than 20 percent impaired, limited or restricted   [facility],Twala Collings 07/31/2013, 12:11 PM

## 2013-07-31 NOTE — Progress Notes (Signed)
Subjective: 1 Day Post-Op Procedure(s) (LRB): LUMBAR SPINAL CORD STIMULATOR INSERTION (N/A) Patient reports pain as mild.  Reports incisional soreness. Legs doing well. Denies numbness, tingling. Has been OOB twice overnight, accidentally pulled out IV in the process. Voiding without difficulty. No other c/o.  Objective: Vital signs in last 24 hours: Temp:  [97.9 F (36.6 C)-98.3 F (36.8 C)] 97.9 F (36.6 C) (02/14 0434) Pulse Rate:  [66-93] 76 (02/14 0434) Resp:  [10-21] 16 (02/14 0434) BP: (88-156)/(41-83) 95/50 mmHg (02/14 0434) SpO2:  [90 %-99 %] 95 % (02/14 0434) Weight:  [90.719 kg (200 lb)] 90.719 kg (200 lb) (02/13 2218)  Intake/Output from previous day: 02/13 0701 - 02/14 0700 In: 3930 [P.O.:870; I.V.:2910; IV Piggyback:150] Out: -  Intake/Output this shift: Total I/O In: 360 [P.O.:360] Out: -    Recent Labs  07/28/13 0905  HGB 12.4    Recent Labs  07/28/13 0905  WBC 7.9  RBC 4.04  HCT 37.1  PLT 388    Recent Labs  07/28/13 0905  NA 144  K 4.2  CL 103  CO2 26  BUN 16  CREATININE 0.85  GLUCOSE 93  CALCIUM 9.5   No results found for this basename: LABPT, INR,  in the last 72 hours  Neurologically intact ABD soft Neurovascular intact Sensation intact distally Intact pulses distally Dorsiflexion/Plantar flexion intact Incision: dressing C/D/I and no drainage No cellulitis present Compartment soft no calf pain or sign of DVT  Assessment/Plan: 1 Day Post-Op Procedure(s) (LRB): LUMBAR SPINAL CORD STIMULATOR INSERTION (N/A) Advance diet Up with therapy D/C IV fluids D/C home today Discussed D/C instructions Discussed with Dr. Shon BatonBrooks this AM  Maria Ford, Maria Ford. 07/31/2013, 8:29 AM

## 2013-07-31 NOTE — Discharge Summary (Signed)
Physician Discharge Summary   Patient ID: Maria Ford MRN: [id number] DOB/AGE: 03/04/1956 58 y.o.  Admit date: 07/30/2013 Discharge date: 07/31/2013  Primary Diagnosis:   CHRONIC PAIN  Admission Diagnoses:  Past Medical History  Diagnosis Date  . Hypothyroidism   . Anxiety   . Depression   . Asthma   . Shortness of breath     with exertion  . Pneumonia 04/2013  . Sleep apnea     uses CPAP  . H/O hiatal hernia   . GERD (gastroesophageal reflux disease)     uses nexium  . Peptic ulcer   . Headache(784.0)     migraines  . Seizures     focal seizures - takes Lamictal  . Arthritis   . Fibromyalgia   . Constipation   . Complication of anesthesia   . PONV (postoperative nausea and vomiting)     she states twice because of Demerol  . COPD (chronic obstructive pulmonary disease)    Discharge Diagnoses:   Active Problems:   Chronic pain  Procedure:  Procedure(s) (LRB): LUMBAR SPINAL CORD STIMULATOR INSERTION (N/A)   Consults: None  HPI:  see H&P    Laboratory Data: Hospital Outpatient Visit on 07/28/2013  Component Date Value Ref Range Status  . Sodium 07/28/2013 144  137 - 147 mEq/L Final  . Potassium 07/28/2013 4.2  3.7 - 5.3 mEq/L Final  . Chloride 07/28/2013 103  96 - 112 mEq/L Final  . CO2 07/28/2013 26  19 - 32 mEq/L Final  . Glucose, Bld 07/28/2013 93  70 - 99 mg/dL Final  . BUN 07/28/2013 16  6 - 23 mg/dL Final  . Creatinine, Ser 07/28/2013 0.85  0.50 - 1.10 mg/dL Final  . Calcium 07/28/2013 9.5  8.4 - 10.5 mg/dL Final  . GFR calc non Af Amer 07/28/2013 75* >90 mL/min Final  . GFR calc Af Amer 07/28/2013 87* >90 mL/min Final   Comment: (NOTE)                          The eGFR has been calculated using the CKD EPI equation.                          This calculation has not been validated in all clinical situations.                          eGFR's persistently <90 mL/min signify possible Chronic Kidney                          Disease.  . WBC  07/28/2013 7.9  4.0 - 10.5 K/uL Final  . RBC 07/28/2013 4.04  3.87 - 5.11 MIL/uL Final  . Hemoglobin 07/28/2013 12.4  12.0 - 15.0 g/dL Final  . HCT 07/28/2013 37.1  36.0 - 46.0 % Final  . MCV 07/28/2013 91.8  78.0 - 100.0 fL Final  . MCH 07/28/2013 30.7  26.0 - 34.0 pg Final  . MCHC 07/28/2013 33.4  30.0 - 36.0 g/dL Final  . RDW 07/28/2013 13.7  11.5 - 15.5 % Final  . Platelets 07/28/2013 388  150 - 400 K/uL Final  . MRSA, PCR 07/28/2013 NEGATIVE  NEGATIVE Final  . Staphylococcus aureus 07/28/2013 NEGATIVE  NEGATIVE Final   Comment:  The Xpert SA Assay (FDA                          approved for NASAL specimens                          in patients over 87 years of age),                          is one component of                          a comprehensive surveillance                          program.  Test performance has                          been validated by [employer] for patients greater                          than or equal to 78 year old.                          It is not intended                          to diagnose infection nor to                          guide or monitor treatment.   No results found for this basename: HGB,  in the last 72 hours No results found for this basename: WBC, RBC, HCT, PLT,  in the last 72 hours No results found for this basename: NA, K, CL, CO2, BUN, CREATININE, GLUCOSE, CALCIUM,  in the last 72 hours No results found for this basename: LABPT, INR,  in the last 72 hours  X-Rays:Dg Chest 2 View  07/30/2013   CLINICAL DATA:  Preoperative examination for stimulator implantation, history of COPD  EXAM: CHEST  2 VIEW  COMPARISON:  None.  FINDINGS: Normal cardiac silhouette and mediastinal contours. There is mild eventration of right hemidiaphragm. No focal airspace opacities. No pleural effusion or pneumothorax. No evidence of edema. No acute osseus abnormalities.  IMPRESSION: No acute  cardiopulmonary disease.   Electronically Signed   By: Sandi Mariscal M.D.   On: 07/30/2013 14:06   Dg Lumbar Spine 2-3 Views  07/30/2013   CLINICAL DATA:  Spinal stimulator placement.  EXAM: LUMBAR SPINE - 2-3 VIEW; DG C-ARM 61-120 MIN  COMPARISON:  None.  FINDINGS: Two views of the spinal thoracolumbar juncture provided. Images demonstrate a spinal stimulator which appears to extend from the level of the T8-9 interspace to mid T10.  IMPRESSION: Spinal stimulator placement.   Electronically Signed   By: Inge Rise M.D.   On: 07/30/2013 18:06   Dg Lumbar Spine 1 View  07/30/2013   CLINICAL DATA:  Intraoperative localization for spinal cord stimulator placement. Question that 6 lumbar vertebrae.  EXAM: LUMBAR SPINE - 1 VIEW  COMPARISON:  Chest  x-ray from the same day  FINDINGS: There are 6 non rib-bearing, lumbar type vertebrae. When comparing with contemporaneously chest x-ray, there are 11 paired ribs. Preoperative imaging spinal numbering not available. The surgical retractors are at the disc space between the inferior most rib-bearing vertebrae. These results were called by telephone at the time of interpretation on 07/30/2013 at 5:14 PM to Dr. Melina Schools , who verbally acknowledged these results.  IMPRESSION: Six lumbar-type, non rib-bearing vertebrae - as above. Surgical retractors are at the disc space between the inferior most rib-bearing vertebrae.   Electronically Signed   By: Jorje Guild M.D.   On: 07/30/2013 17:16   Dg C-arm 61-120 Min  07/30/2013   CLINICAL DATA:  Spinal stimulator placement.  EXAM: LUMBAR SPINE - 2-3 VIEW; DG C-ARM 61-120 MIN  COMPARISON:  None.  FINDINGS: Two views of the spinal thoracolumbar juncture provided. Images demonstrate a spinal stimulator which appears to extend from the level of the T8-9 interspace to mid T10.  IMPRESSION: Spinal stimulator placement.   Electronically Signed   By: Inge Rise M.D.   On: 07/30/2013 18:06    EKG: Orders placed  during the hospital encounter of 07/30/13  . EKG 12-LEAD  . EKG 12-LEAD     Hospital Course: Patient was admitted to [facility] and taken to the OR and underwent the above state procedure without complications.  Patient tolerated the procedure well and was later transferred to the recovery room and then to the orthopaedic floor for postoperative care.  They were given PO and IV analgesics for pain control following their surgery.  They were given 24 hours of postoperative antibiotics.   PT was consulted postop to assist with mobility and transfers. Spinal cord stim rep saw pt POD#1 in AM. Discharge planning was consulted to help with postop disposition and equipment needs.  Patient had a good night on the evening of surgery and started to get up OOB with therapy on day one. Patient was seen in rounds and was ready to go home on day one.  They were given discharge instructions and dressing directions.  They were instructed on when to follow up in the office with Dr. Rolena Infante.  Discharge Medications: Prior to Admission medications   Medication Sig Start Date End Date Taking? Authorizing Provider  albuterol (PROVENTIL HFA;VENTOLIN HFA) 108 (90 BASE) MCG/ACT inhaler Inhale 2 puffs into the lungs every 6 (six) hours as needed for wheezing or shortness of breath.   Yes Historical Provider, MD  calcium carbonate (OS-CAL - DOSED IN MG OF ELEMENTAL CALCIUM) 1250 MG tablet Take 1 tablet by mouth daily with breakfast.   Yes Historical Provider, MD  cholecalciferol (VITAMIN D) 1000 UNITS tablet Take 1,000 Units by mouth daily.   Yes Historical Provider, MD  clonazePAM (KLONOPIN) 0.5 MG tablet Take 0.5 mg by mouth 3 (three) times daily.   Yes Historical Provider, MD  clorazepate (TRANXENE) 3.75 MG tablet Take 7.5 mg by mouth 2 (two) times daily as needed for anxiety.    Yes Historical Provider, MD  doxycycline (DORYX) 100 MG EC tablet Take 100 mg by mouth daily.   Yes Historical Provider, MD  DULoxetine  HCl (CYMBALTA PO) Take 10 mg by mouth daily.    Yes Historical Provider, MD  esomeprazole (NEXIUM) 40 MG capsule Take 40 mg by mouth 2 (two) times daily before a meal.   Yes Historical Provider, MD  estradiol (ESTRACE) 2 MG tablet Take 2 mg by mouth daily.  Yes Historical Provider, MD  Fluticasone-Salmeterol (ADVAIR) 500-50 MCG/DOSE AEPB Inhale 2 puffs into the lungs 2 (two) times daily.   Yes Historical Provider, MD  gabapentin (NEURONTIN) 300 MG capsule Take 300 mg by mouth 4 (four) times daily.   Yes Historical Provider, MD  lamoTRIgine (LAMICTAL) 200 MG tablet Take 200-400 mg by mouth 4 (four) times daily. Take 2 tablets in the morning, 1 tablet in the afternoon, 1 tablet in the evening, and at bedtime.   Yes Historical Provider, MD  levothyroxine (SYNTHROID, LEVOTHROID) 50 MCG tablet Take 50 mcg by mouth daily before breakfast.   Yes Historical Provider, MD  montelukast (SINGULAIR) 10 MG tablet Take 10 mg by mouth daily.   Yes Historical Provider, MD  polyethylene glycol (MIRALAX / GLYCOLAX) packet Take 34 g by mouth 4 (four) times daily.   Yes Historical Provider, MD  QUEtiapine (SEROQUEL) 50 MG tablet Take 50 mg by mouth at bedtime.   Yes Historical Provider, MD  vitamin C (ASCORBIC ACID) 500 MG tablet Take 500 mg by mouth daily.   Yes Historical Provider, MD  docusate sodium (COLACE) 100 MG capsule Take 1 capsule (100 mg total) by mouth 2 (two) times daily. 07/30/13   Benjiman Core, PA-C  methocarbamol (ROBAXIN) 500 MG tablet Take 1 tablet (500 mg total) by mouth 4 (four) times daily. 07/30/13   Benjiman Core, PA-C  ondansetron (ZOFRAN ODT) 4 MG disintegrating tablet Take 1 tablet (4 mg total) by mouth every 8 (eight) hours as needed for nausea or vomiting. 07/30/13   Benjiman Core, PA-C  oxyCODONE-acetaminophen (ROXICET) 5-325 MG per tablet Take 1 tablet by mouth every 6 (six) hours as needed for severe pain. 07/30/13   Benjiman Core, PA-C  zolpidem (AMBIEN) 10 MG tablet Take 10 mg by mouth at bedtime  as needed for sleep.    Historical Provider, MD    Diet: Regular diet Activity:WBAT Follow-up:in 10-14 days Disposition - Home Discharged Condition: good   Discharge Orders   Future Orders Complete By Expires   Call MD / Call 911  As directed    Comments:     If you experience chest pain or shortness of breath, CALL 911 and be transported to the hospital emergency room.  If you develope a fever above 101 F, pus (white drainage) or increased drainage or redness at the wound, or calf pain, call your surgeon's office.   Constipation Prevention  As directed    Comments:     Drink plenty of fluids.  Prune juice may be helpful.  You may use a stool softener, such as Colace (over the counter) 100 mg twice a day.  Use MiraLax (over the counter) for constipation as needed.   Diet - low sodium heart healthy  As directed    Increase activity slowly as tolerated  As directed        Medication List    STOP taking these medications       aspirin EC 81 MG tablet     cyclobenzaprine 10 MG tablet  Commonly known as:  FLEXERIL     multivitamin with minerals Tabs tablet      TAKE these medications       albuterol 108 (90 BASE) MCG/ACT inhaler  Commonly known as:  PROVENTIL HFA;VENTOLIN HFA  Inhale 2 puffs into the lungs every 6 (six) hours as needed for wheezing or shortness of breath.     calcium carbonate 1250 MG tablet  Commonly known as:  OS-CAL - dosed in  mg of elemental calcium  Take 1 tablet by mouth daily with breakfast.     cholecalciferol 1000 UNITS tablet  Commonly known as:  VITAMIN D  Take 1,000 Units by mouth daily.     clonazePAM 0.5 MG tablet  Commonly known as:  KLONOPIN  Take 0.5 mg by mouth 3 (three) times daily.     clorazepate 3.75 MG tablet  Commonly known as:  TRANXENE  Take 7.5 mg by mouth 2 (two) times daily as needed for anxiety.     CYMBALTA PO  Take 10 mg by mouth daily.     docusate sodium 100 MG capsule  Commonly known as:  COLACE  Take 1  capsule (100 mg total) by mouth 2 (two) times daily.     doxycycline 100 MG EC tablet  Commonly known as:  DORYX  Take 100 mg by mouth daily.     esomeprazole 40 MG capsule  Commonly known as:  NEXIUM  Take 40 mg by mouth 2 (two) times daily before a meal.     estradiol 2 MG tablet  Commonly known as:  ESTRACE  Take 2 mg by mouth daily.     Fluticasone-Salmeterol 500-50 MCG/DOSE Aepb  Commonly known as:  ADVAIR  Inhale 2 puffs into the lungs 2 (two) times daily.     gabapentin 300 MG capsule  Commonly known as:  NEURONTIN  Take 300 mg by mouth 4 (four) times daily.     lamoTRIgine 200 MG tablet  Commonly known as:  LAMICTAL  Take 200-400 mg by mouth 4 (four) times daily. Take 2 tablets in the morning, 1 tablet in the afternoon, 1 tablet in the evening, and at bedtime.     levothyroxine 50 MCG tablet  Commonly known as:  SYNTHROID, LEVOTHROID  Take 50 mcg by mouth daily before breakfast.     methocarbamol 500 MG tablet  Commonly known as:  ROBAXIN  Take 1 tablet (500 mg total) by mouth 4 (four) times daily.     montelukast 10 MG tablet  Commonly known as:  SINGULAIR  Take 10 mg by mouth daily.     ondansetron 4 MG disintegrating tablet  Commonly known as:  ZOFRAN ODT  Take 1 tablet (4 mg total) by mouth every 8 (eight) hours as needed for nausea or vomiting.     oxyCODONE-acetaminophen 5-325 MG per tablet  Commonly known as:  ROXICET  Take 1 tablet by mouth every 6 (six) hours as needed for severe pain.     polyethylene glycol packet  Commonly known as:  MIRALAX / GLYCOLAX  Take 34 g by mouth 4 (four) times daily.     QUEtiapine 50 MG tablet  Commonly known as:  SEROQUEL  Take 50 mg by mouth at bedtime.     vitamin C 500 MG tablet  Commonly known as:  ASCORBIC ACID  Take 500 mg by mouth daily.     zolpidem 10 MG tablet  Commonly known as:  AMBIEN  Take 10 mg by mouth at bedtime as needed for sleep.         SignedCecilie Kicks. 07/31/2013, 1:41  PM

## 2013-07-31 NOTE — Progress Notes (Signed)
UR Completed.  Maria Ford Jane [phone] 07/31/2013  

## 2013-07-31 NOTE — Op Note (Signed)
Maria Ford, Maria Ford                ACCOUNT NO.:  [id number]  MEDICAL RECORD NO.:  [id number]  LOCATION:  5N31C                        FACILITY:  [facility]  PHYSICIAN:  Alvy Beal, MD    DATE OF BIRTH:  29-Jan-1956  DATE OF PROCEDURE:  07/30/2013 DATE OF DISCHARGE:                              OPERATIVE REPORT   PREOPERATIVE DIAGNOSIS:  Chronic pain syndrome.  POSTOPERATIVE DIAGNOSIS:  Chronic pain syndrome.  OPERATIVE PROCEDURE:  Implantation of spinal cord stimulator.  COMPLICATIONS:  None.  CONDITION:  Stable.  HISTORY:  This is a very pleasant woman who has been having severe debilitating pain for some time.  A spinal cord stimulator trial was done and she actually had excellent relief of her symptoms.  As a result, she elected to proceed with permanent implantation.  After discussing all risks, benefits, and alternatives to surgery, she consented to the aforementioned procedure.  OPERATIVE NOTE:  The patient was brought to the operating room, placed supine on the operating table.  After successful induction of general anesthesia and endotracheal intubation, TEDs and SCDs were applied.  She was turned prone onto the Kremlin frame.  The thoracolumbar spine was prepped and draped in a standard fashion.  Time-out was taken to confirm patient, procedure, and all other pertinent important data.  Once this was completed, I then used x-ray to count up from L5 to identify the T10 lamina.  She was being programed at the T9 vertebral body, so I elected to do a T10 laminotomy.  It was at this time that I discovered that she had extra lumbar vertebrae.  I then looked at the trial x-ray, seen on the fluoro view that the rep from [facility] [employer] brought.  The patient was stimulating at vertebral body that was associated with the fourth lowest rib.  This correlated to T9.  As such, I counted up in the AP plane from the left rib bearing vertebrae which I labeled as T12.  I infiltrated the  incision.  I did take an intraoperative AP thoracolumbar x-ray.  It was at this time that the radiologist notified me that she only has 11 rib-bearing vertebrae.  However, I did indicate that counting from inferior to superior, she has 6 lumbar vertebrae.  So, I indicated that the retractor that I had in the field of the x-ray was at the disk space between the bottom to ribs.  This again correlated with trial imaging x- ray.  Once I confirmed and double confirmed that I was at the appropriate level, I continued my dissection down to the spinous process.  I exposed T10 and T11 spinous processes.  I stripped the paraspinal muscles using Bovie and Cobb elevator.  Once I had the posterior elements exposed, I then removed the spinous processes of T10 and used a fine nerve hook to get underneath the lamina.  I then used a 2-mm and 3-mm Kerrison to perform a generous laminotomy of T10.  I then removed the ligamentum flavum using a 2-mm Kerrison, and then exposed the underlying thecal sac.  I then passed the dural elevator and then obtained the implant.  The implant was easily slid up to just beyond  the T8-9 disk space.  It was in the midline.  Once this was properly positioned, I then secured it through bone holes with FiberWire to the T11 spinous process.  With it secured, I then wrapped it through the T11- T12 interspinous space.  The spinal cord stimulator  was now secured in position and properly placed.  I then made a second incision over the right gluteal region where she wanted the battery placed.  I made a pocket 2.5 cm deep and then used the submuscular passer to connect to. I then advanced the wires through the submuscular Passer to the gluteal incision.  I connected it to the battery and torqued the leads appropriately.  I then wrapped the remaining excess lead underneath the battery and then secured it with #1 Vicryl to the deep fascia.  I irrigated the wound copiously with normal  saline, and then tested the battery.  The battery was functioning fine according to the rep.  I then irrigated both wounds copiously with normal saline and made sure I had hemostasis.  Placed a thrombin-soaked Gelfoam patty over the exposed thecal sac and I closed both deep fascias with interrupted #1 Vicryl sutures, then a layer of 2-0 Vicryl sutures, then a layer of 3-0 Monocryl.  Steri-Strips and dry dressing were applied.  The patient was extubated, transferred to PACU without incident.  At the end of the case, all needle and sponge counts were correct.     Alvy Bealahari D Lilac Hoff, MD     DDB/MEDQ  D:  07/30/2013  T:  07/31/2013  Job:  [id number]

## 2013-07-31 NOTE — Evaluation (Signed)
Occupational Therapy Evaluation Patient Details Name: Maria AweConnie Ford MRN: [id number] DOB: 1956-04-04 Today's Date: 07/31/2013 Time: [id number] OT Time Calculation (min): 8 min  OT Assessment / Plan / Recommendation History of present illness LUMBAR SPINAL CORD STIMULATOR INSERTION    Clinical Impression   This 58 yo female admitted and underwent above presents to acute OT with all education completed, will D/C from acute OT.    OT Assessment  Patient does not need any further OT services    Follow Up Recommendations  No OT follow up       Equipment Recommendations  None recommended by OT          Precautions / Restrictions Precautions Precautions: None Precaution Booklet Issued: Yes (comment) Precaution Comments: Made pt aware that it is good to follow back precautions whenever anyone has back issues       ADL  ADL Comments: I went over different way she can work on getting her LB dressed to help protect her back. I explained to her that it would be better to use wet wipes for back peri-hygiene to avoid twisting as much     Acute Rehab OT Goals Patient Stated Goal: Home today  Visit Information  Last OT Received On: 07/31/13 Assistance Needed: +1 History of Present Illness: LUMBAR SPINAL CORD STIMULATOR INSERTION        Prior Functioning     Home Living Family/patient expects to be discharged to:: Private residence Living Arrangements: Spouse/significant other Available Help at Discharge: Family Home Equipment: Hand held shower head Prior Function Level of Independence: Independent Communication Communication: No difficulties Dominant Hand: Right         Vision/Perception Vision - History Patient Visual Report: No change from baseline   Cognition  Cognition Arousal/Alertness: Awake/alert Behavior During Therapy: WFL for tasks assessed/performed Overall Cognitive Status: Within Functional Limits for tasks assessed    Extremity/Trunk Assessment  Upper Extremity Assessment Upper Extremity Assessment: Overall WFL for tasks assessed              End of Session OT - End of Session Patient left: in bed  GO Functional Assessment Tool Used: Clincal observation Functional Limitation: Self care Self Care Current Status (H0865(G8987): At least 20 percent but less than 40 percent impaired, limited or restricted Self Care Goal Status (H8469(G8988): At least 20 percent but less than 40 percent impaired, limited or restricted Self Care Discharge Status [phone](G8989): At least 20 percent but less than 40 percent impaired, limited or restricted   Evette GeorgesLeonard, Maria Ford Maria Ford [phone] 07/31/2013, 11:56 AM

## 2013-08-02 NOTE — Discharge Summary (Signed)
Agree with above 

## 2013-08-03 ENCOUNTER — Encounter (HOSPITAL_COMMUNITY): Payer: Self-pay | Admitting: Orthopedic Surgery

## 2014-06-03 ENCOUNTER — Other Ambulatory Visit: Payer: Self-pay | Admitting: Physical Medicine and Rehabilitation

## 2014-06-03 DIAGNOSIS — Z79891 Long term (current) use of opiate analgesic: Secondary | ICD-10-CM

## 2014-06-08 ENCOUNTER — Inpatient Hospital Stay: Admission: RE | Admit: 2014-06-08 | Payer: Medicare Other | Source: Ambulatory Visit

## 2014-06-13 ENCOUNTER — Ambulatory Visit
Admission: RE | Admit: 2014-06-13 | Discharge: 2014-06-13 | Disposition: A | Discharge status: Home / Self Care | Payer: Medicare Other | Source: Ambulatory Visit | Attending: Physical Medicine and Rehabilitation | Admitting: Physical Medicine and Rehabilitation

## 2014-06-13 DIAGNOSIS — Z79891 Long term (current) use of opiate analgesic: Secondary | ICD-10-CM

## 2014-11-24 ENCOUNTER — Other Ambulatory Visit: Payer: Self-pay | Admitting: Physical Medicine and Rehabilitation

## 2014-11-24 DIAGNOSIS — M961 Postlaminectomy syndrome, not elsewhere classified: Secondary | ICD-10-CM

## 2014-11-25 ENCOUNTER — Ambulatory Visit
Admission: RE | Admit: 2014-11-25 | Discharge: 2014-11-25 | Disposition: A | Discharge status: Home / Self Care | Payer: Medicare Other | Source: Ambulatory Visit | Attending: Physical Medicine and Rehabilitation | Admitting: Physical Medicine and Rehabilitation

## 2014-11-25 DIAGNOSIS — M961 Postlaminectomy syndrome, not elsewhere classified: Secondary | ICD-10-CM

## 2014-11-29 DIAGNOSIS — E663 Overweight: Secondary | ICD-10-CM | POA: Insufficient documentation

## 2014-12-29 DIAGNOSIS — G43909 Migraine, unspecified, not intractable, without status migrainosus: Secondary | ICD-10-CM | POA: Insufficient documentation

## 2015-05-31 DIAGNOSIS — K645 Perianal venous thrombosis: Secondary | ICD-10-CM | POA: Insufficient documentation

## 2015-06-30 DIAGNOSIS — J45901 Unspecified asthma with (acute) exacerbation: Secondary | ICD-10-CM | POA: Insufficient documentation

## 2015-09-21 ENCOUNTER — Other Ambulatory Visit: Payer: Self-pay | Admitting: Orthopedic Surgery

## 2015-09-21 DIAGNOSIS — M961 Postlaminectomy syndrome, not elsewhere classified: Secondary | ICD-10-CM

## 2015-09-27 ENCOUNTER — Ambulatory Visit
Admission: RE | Admit: 2015-09-27 | Discharge: 2015-09-27 | Disposition: A | Discharge status: Home / Self Care | Payer: Self-pay | Source: Ambulatory Visit | Attending: Orthopedic Surgery | Admitting: Orthopedic Surgery

## 2015-09-27 ENCOUNTER — Ambulatory Visit
Admission: RE | Admit: 2015-09-27 | Discharge: 2015-09-27 | Disposition: A | Discharge status: Home / Self Care | Payer: Medicare Other | Source: Ambulatory Visit | Attending: Orthopedic Surgery | Admitting: Orthopedic Surgery

## 2015-09-27 ENCOUNTER — Other Ambulatory Visit: Payer: Self-pay | Admitting: Radiology

## 2015-09-27 ENCOUNTER — Other Ambulatory Visit: Payer: Self-pay | Admitting: Orthopedic Surgery

## 2015-09-27 ENCOUNTER — Encounter: Payer: Self-pay | Admitting: Radiology

## 2015-09-27 VITALS — BP 135/75 | HR 57

## 2015-09-27 DIAGNOSIS — M961 Postlaminectomy syndrome, not elsewhere classified: Secondary | ICD-10-CM

## 2015-09-27 DIAGNOSIS — G8929 Other chronic pain: Secondary | ICD-10-CM

## 2015-09-27 MED ORDER — IOPAMIDOL (ISOVUE-M 200) INJECTION 41%
15.0000 mL | Freq: Once | INTRAMUSCULAR | Status: AC
  Administered 2015-09-27: 15 mL via INTRATHECAL

## 2015-09-27 MED ORDER — DIAZEPAM 5 MG PO TABS
10.0000 mg | ORAL_TABLET | Freq: Once | ORAL | Status: AC
  Administered 2015-09-27: 10 mg via ORAL

## 2015-09-27 NOTE — Progress Notes (Signed)
Patient states she has been off Cymbalta and Seroquel for the past two days.

## 2015-09-27 NOTE — Discharge Instructions (Signed)
Myelogram Discharge Instructions  1. Go home and rest quietly for the next 24 hours.  It is important to lie flat for the next 24 hours.  Get up only to go to the restroom.  You may lie in the bed or on a couch on your back, your stomach, your left side or your right side.  You may have one pillow under your head.  You may have pillows between your knees while you are on your side or under your knees while you are on your back.  2. DO NOT drive today.  Recline the seat as far back as it will go, while still wearing your seat belt, on the way home.  3. You may get up to go to the bathroom as needed.  You may sit up for 10 minutes to eat.  You may resume your normal diet and medications unless otherwise indicated.  Drink lots of extra fluids today and tomorrow.  4. The incidence of headache, nausea, or vomiting is about 5% (one in 20 patients).  If you develop a headache, lie flat and drink plenty of fluids until the headache goes away.  Caffeinated beverages may be helpful.  If you develop severe nausea and vomiting or a headache that does not go away with flat bed rest, call [phone].  5. You may resume normal activities after your 24 hours of bed rest is over; however, do not exert yourself strongly or do any heavy lifting tomorrow. If when you get up you have a headache when standing, go back to bed and force fluids for another 24 hours.  6. Call your physician for a follow-up appointment.  The results of your myelogram will be sent directly to your physician by the following day.  7. If you have any questions or if complications develop after you arrive home, please call [phone].  Discharge instructions have been explained to the patient.  The patient, or the person responsible for the patient, fully understands these instructions.       May resume Cymbalta and Seroquel on September 28, 2015, after 11:00 am.

## 2015-10-17 ENCOUNTER — Other Ambulatory Visit: Payer: Self-pay | Admitting: Orthopedic Surgery

## 2015-10-17 DIAGNOSIS — M1612 Unilateral primary osteoarthritis, left hip: Secondary | ICD-10-CM

## 2015-10-26 ENCOUNTER — Ambulatory Visit
Admission: RE | Admit: 2015-10-26 | Discharge: 2015-10-26 | Disposition: A | Discharge status: Home / Self Care | Payer: Medicare Other | Source: Ambulatory Visit | Attending: Orthopedic Surgery | Admitting: Orthopedic Surgery

## 2015-10-26 DIAGNOSIS — M1612 Unilateral primary osteoarthritis, left hip: Secondary | ICD-10-CM

## 2015-10-26 MED ORDER — IOPAMIDOL (ISOVUE-M 200) INJECTION 41%
15.0000 mL | Freq: Once | INTRAMUSCULAR | Status: AC
  Administered 2015-10-26: 15 mL via INTRA_ARTICULAR

## 2017-01-16 ENCOUNTER — Other Ambulatory Visit: Payer: Self-pay | Admitting: Chiropractic Medicine

## 2017-01-16 DIAGNOSIS — M961 Postlaminectomy syndrome, not elsewhere classified: Secondary | ICD-10-CM

## 2017-01-29 ENCOUNTER — Other Ambulatory Visit: Payer: Medicare Other

## 2017-06-02 ENCOUNTER — Other Ambulatory Visit: Payer: Self-pay | Admitting: Physical Medicine and Rehabilitation

## 2017-06-02 DIAGNOSIS — M961 Postlaminectomy syndrome, not elsewhere classified: Secondary | ICD-10-CM

## 2017-06-16 ENCOUNTER — Inpatient Hospital Stay
Admission: RE | Admit: 2017-06-16 | Discharge: 2017-06-16 | Disposition: A | Discharge status: Home / Self Care | Payer: Medicare Other | Source: Ambulatory Visit | Attending: Physical Medicine and Rehabilitation | Admitting: Physical Medicine and Rehabilitation

## 2017-06-16 ENCOUNTER — Other Ambulatory Visit: Payer: Medicare Other

## 2017-06-16 NOTE — Discharge Instructions (Signed)
Myelogram Discharge Instructions  1. Go home and rest quietly for the next 24 hours.  It is important to lie flat for the next 24 hours.  Get up only to go to the restroom.  You may lie in the bed or on a couch on your back, your stomach, your left side or your right side.  You may have one pillow under your head.  You may have pillows between your knees while you are on your side or under your knees while you are on your back.  2. DO NOT drive today.  Recline the seat as far back as it will go, while still wearing your seat belt, on the way home.  3. You may get up to go to the bathroom as needed.  You may sit up for 10 minutes to eat.  You may resume your normal diet and medications unless otherwise indicated.  Drink lots of extra fluids today and tomorrow.  4. The incidence of headache, nausea, or vomiting is about 5% (one in 20 patients).  If you develop a headache, lie flat and drink plenty of fluids until the headache goes away.  Caffeinated beverages may be helpful.  If you develop severe nausea and vomiting or a headache that does not go away with flat bed rest, call [phone].  5. You may resume normal activities after your 24 hours of bed rest is over; however, do not exert yourself strongly or do any heavy lifting tomorrow. If when you get up you have a headache when standing, go back to bed and force fluids for another 24 hours.  6. Call your physician for a follow-up appointment.  The results of your myelogram will be sent directly to your physician by the following day.  7. If you have any questions or if complications develop after you arrive home, please call [phone].  Discharge instructions have been explained to the patient.  The patient, or the person responsible for the patient, fully understands these instructions  YOU MAY RESTART YOUR CYMBALTA AND SEROQUEL TOMORROW 06/17/2017 AT 10:30AM.

## 2017-06-25 ENCOUNTER — Other Ambulatory Visit: Payer: Medicare Other

## 2017-07-02 ENCOUNTER — Ambulatory Visit
Admission: RE | Admit: 2017-07-02 | Discharge: 2017-07-02 | Disposition: A | Discharge status: Home / Self Care | Payer: Self-pay | Source: Ambulatory Visit | Attending: Physical Medicine and Rehabilitation | Admitting: Physical Medicine and Rehabilitation

## 2017-07-02 ENCOUNTER — Ambulatory Visit
Admission: RE | Admit: 2017-07-02 | Discharge: 2017-07-02 | Disposition: A | Discharge status: Home / Self Care | Payer: Medicare Other | Source: Ambulatory Visit | Attending: Physical Medicine and Rehabilitation | Admitting: Physical Medicine and Rehabilitation

## 2017-07-02 ENCOUNTER — Other Ambulatory Visit: Payer: Self-pay | Admitting: Physical Medicine and Rehabilitation

## 2017-07-02 DIAGNOSIS — M961 Postlaminectomy syndrome, not elsewhere classified: Secondary | ICD-10-CM

## 2017-07-02 MED ORDER — IOPAMIDOL (ISOVUE-M 200) INJECTION 41%
20.0000 mL | Freq: Once | INTRAMUSCULAR | Status: AC
  Administered 2017-07-02: 20 mL via INTRATHECAL

## 2017-07-02 MED ORDER — ONDANSETRON HCL 4 MG/2ML IJ SOLN: 4.0000 mg | Freq: Four times a day (QID) | INTRAMUSCULAR | Status: DC | PRN

## 2017-07-02 MED ORDER — ONDANSETRON HCL 4 MG/2ML IJ SOLN
4.0000 mg | Freq: Once | INTRAMUSCULAR | Status: AC
  Administered 2017-07-02: 4 mg via INTRAMUSCULAR

## 2017-07-02 MED ORDER — DIAZEPAM 5 MG PO TABS
10.0000 mg | ORAL_TABLET | Freq: Once | ORAL | Status: AC
  Administered 2017-07-02: 10 mg via ORAL

## 2017-07-02 MED ORDER — HYDROMORPHONE HCL 1 MG/ML IJ SOLN
1.0000 mg | Freq: Once | INTRAMUSCULAR | Status: AC
  Administered 2017-07-02: 1.5 mg via INTRAMUSCULAR

## 2017-07-02 NOTE — Progress Notes (Signed)
Prescription called in to [facility] for insomnia per Dr Braxton FeathersAllan Grady.  Valium 5mg  (#6) Take 1-2 tablets every 6-8hrs as needed for insomnia.

## 2017-07-02 NOTE — Discharge Instructions (Signed)
Myelogram Discharge Instructions  1. Go home and rest quietly for the next 24 hours.  It is important to lie flat for the next 24 hours.  Get up only to go to the restroom.  You may lie in the bed or on a couch on your back, your stomach, your left side or your right side.  You may have one pillow under your head.  You may have pillows between your knees while you are on your side or under your knees while you are on your back.  2. DO NOT drive today.  Recline the seat as far back as it will go, while still wearing your seat belt, on the way home.  3. You may get up to go to the bathroom as needed.  You may sit up for 10 minutes to eat.  You may resume your normal diet and medications unless otherwise indicated.  Drink lots of extra fluids today and tomorrow.  4. The incidence of headache, nausea, or vomiting is about 5% (one in 20 patients).  If you develop a headache, lie flat and drink plenty of fluids until the headache goes away.  Caffeinated beverages may be helpful.  If you develop severe nausea and vomiting or a headache that does not go away with flat bed rest, call [phone].  5. You may resume normal activities after your 24 hours of bed rest is over; however, do not exert yourself strongly or do any heavy lifting tomorrow. If when you get up you have a headache when standing, go back to bed and force fluids for another 24 hours.  6. Call your physician for a follow-up appointment.  The results of your myelogram will be sent directly to your physician by the following day.  7. If you have any questions or if complications develop after you arrive home, please call [phone].  Discharge instructions have been explained to the patient.  The patient, or the person responsible for the patient, fully understands these instructions  YOU MAY RESTART YOUR CYMBALTA AND SEROQUEL AT 10:30 AM TOMORROW 07/03/2017.

## 2017-07-02 NOTE — Progress Notes (Signed)
Patient states she has been off Cymbalta and Seroquel for at least the past two days.

## 2017-09-30 ENCOUNTER — Other Ambulatory Visit: Payer: Self-pay | Admitting: Orthopedic Surgery

## 2017-10-03 ENCOUNTER — Other Ambulatory Visit: Payer: Self-pay | Admitting: Orthopedic Surgery

## 2017-10-03 DIAGNOSIS — M1612 Unilateral primary osteoarthritis, left hip: Secondary | ICD-10-CM

## 2017-10-22 ENCOUNTER — Ambulatory Visit
Admission: RE | Admit: 2017-10-22 | Discharge: 2017-10-22 | Disposition: A | Discharge status: Home / Self Care | Payer: Medicare Other | Source: Ambulatory Visit | Attending: Orthopedic Surgery | Admitting: Orthopedic Surgery

## 2017-10-22 DIAGNOSIS — M1612 Unilateral primary osteoarthritis, left hip: Secondary | ICD-10-CM

## 2017-10-22 MED ORDER — IOPAMIDOL (ISOVUE-M 200) INJECTION 41%
12.0000 mL | Freq: Once | INTRAMUSCULAR | Status: AC
  Administered 2017-10-22: 12 mL via INTRA_ARTICULAR

## 2017-11-11 ENCOUNTER — Other Ambulatory Visit: Payer: Self-pay | Admitting: Orthopedic Surgery

## 2017-11-11 DIAGNOSIS — M76899 Other specified enthesopathies of unspecified lower limb, excluding foot: Secondary | ICD-10-CM

## 2017-11-19 ENCOUNTER — Ambulatory Visit
Admission: RE | Admit: 2017-11-19 | Discharge: 2017-11-19 | Disposition: A | Discharge status: Home / Self Care | Payer: Medicare Other | Source: Ambulatory Visit | Attending: Orthopedic Surgery | Admitting: Orthopedic Surgery

## 2017-11-19 DIAGNOSIS — M76899 Other specified enthesopathies of unspecified lower limb, excluding foot: Secondary | ICD-10-CM

## 2017-11-19 MED ORDER — METHYLPREDNISOLONE ACETATE 40 MG/ML INJ SUSP (RADIOLOG
120.0000 mg | Freq: Once | INTRAMUSCULAR | Status: AC
  Administered 2017-11-19: 120 mg via INTRA_ARTICULAR

## 2017-11-19 MED ORDER — IOPAMIDOL (ISOVUE-M 200) INJECTION 41%
1.0000 mL | Freq: Once | INTRAMUSCULAR | Status: AC
  Administered 2017-11-19: 1 mL

## 2018-05-19 ENCOUNTER — Other Ambulatory Visit: Payer: Self-pay | Admitting: Chiropractic Medicine

## 2018-05-19 DIAGNOSIS — M961 Postlaminectomy syndrome, not elsewhere classified: Secondary | ICD-10-CM

## 2018-05-26 ENCOUNTER — Other Ambulatory Visit: Payer: Self-pay | Admitting: Chiropractic Medicine

## 2018-05-26 ENCOUNTER — Ambulatory Visit
Admission: RE | Admit: 2018-05-26 | Discharge: 2018-05-26 | Disposition: A | Discharge status: Home / Self Care | Payer: Medicare Other | Source: Ambulatory Visit | Attending: Chiropractic Medicine | Admitting: Chiropractic Medicine

## 2018-05-26 DIAGNOSIS — M961 Postlaminectomy syndrome, not elsewhere classified: Secondary | ICD-10-CM

## 2018-05-26 MED ORDER — IOPAMIDOL (ISOVUE-M 200) INJECTION 41%
1.0000 mL | Freq: Once | INTRAMUSCULAR | Status: AC
  Administered 2018-05-26: 1 mL via EPIDURAL

## 2018-05-26 MED ORDER — METHYLPREDNISOLONE ACETATE 40 MG/ML INJ SUSP (RADIOLOG
120.0000 mg | Freq: Once | INTRAMUSCULAR | Status: AC
  Administered 2018-05-26: 120 mg via EPIDURAL

## 2018-05-26 NOTE — Discharge Instructions (Signed)

## 2018-08-05 ENCOUNTER — Other Ambulatory Visit: Payer: Self-pay | Admitting: Physical Medicine and Rehabilitation

## 2018-08-05 DIAGNOSIS — M5136 Other intervertebral disc degeneration, lumbar region: Secondary | ICD-10-CM

## 2018-08-12 ENCOUNTER — Inpatient Hospital Stay: Admission: RE | Admit: 2018-08-12 | Payer: Medicare Other | Source: Ambulatory Visit

## 2018-08-26 ENCOUNTER — Ambulatory Visit
Admission: RE | Admit: 2018-08-26 | Discharge: 2018-08-26 | Disposition: A | Discharge status: Home / Self Care | Payer: Medicare Other | Source: Ambulatory Visit | Attending: Physical Medicine and Rehabilitation | Admitting: Physical Medicine and Rehabilitation

## 2018-08-26 ENCOUNTER — Other Ambulatory Visit: Payer: Self-pay

## 2018-08-26 ENCOUNTER — Other Ambulatory Visit: Payer: Self-pay | Admitting: Physical Medicine and Rehabilitation

## 2018-08-26 DIAGNOSIS — M5136 Other intervertebral disc degeneration, lumbar region: Secondary | ICD-10-CM

## 2018-08-26 MED ORDER — IOPAMIDOL (ISOVUE-M 200) INJECTION 41%
1.0000 mL | Freq: Once | INTRAMUSCULAR | Status: AC
  Administered 2018-08-26: 1 mL via EPIDURAL

## 2018-08-26 MED ORDER — METHYLPREDNISOLONE ACETATE 40 MG/ML INJ SUSP (RADIOLOG
120.0000 mg | Freq: Once | INTRAMUSCULAR | Status: AC
  Administered 2018-08-26: 120 mg via EPIDURAL

## 2019-02-11 ENCOUNTER — Other Ambulatory Visit: Payer: Self-pay | Admitting: Physical Medicine and Rehabilitation

## 2019-02-11 DIAGNOSIS — M5416 Radiculopathy, lumbar region: Secondary | ICD-10-CM

## 2019-02-19 ENCOUNTER — Other Ambulatory Visit: Payer: Medicare Other

## 2019-02-24 ENCOUNTER — Ambulatory Visit
Admission: RE | Admit: 2019-02-24 | Discharge: 2019-02-24 | Disposition: A | Discharge status: Home / Self Care | Payer: Medicare Other | Source: Ambulatory Visit | Attending: Physical Medicine and Rehabilitation | Admitting: Physical Medicine and Rehabilitation

## 2019-02-24 ENCOUNTER — Other Ambulatory Visit: Payer: Self-pay

## 2019-02-24 ENCOUNTER — Other Ambulatory Visit: Payer: Self-pay | Admitting: Physical Medicine and Rehabilitation

## 2019-02-24 DIAGNOSIS — M5416 Radiculopathy, lumbar region: Secondary | ICD-10-CM

## 2019-02-24 MED ORDER — IOPAMIDOL (ISOVUE-M 200) INJECTION 41%: 1.0000 mL | Freq: Once | INTRAMUSCULAR | Status: DC

## 2019-02-24 MED ORDER — METHYLPREDNISOLONE ACETATE 40 MG/ML INJ SUSP (RADIOLOG: 120.0000 mg | Freq: Once | INTRAMUSCULAR | Status: DC

## 2019-02-24 NOTE — Discharge Instructions (Signed)

## 2019-06-03 IMAGING — CT CT L SPINE W/ CM
1 of 7 series · 5 of 14 positions shown, 7 images · non-contrast
Comparison: Lumbar myelogram 09/27/2015

CLINICAL DATA: Lumbar post-laminectomy syndrome. Low back and
bilateral lower extremity pain.
TECHNIQUE: Contiguous axial images were obtained through the Lumbar spine after
the intrathecal infusion of infusion. Coronal and sagittal
reconstructions were obtained of the axial image sets.

[Series 2: l spine soft · axial · 0.34mm/px · z∈[-34,+116]mm · 5 of 76 slices shown, 7 images]
[im 13/76  soft-tissue]
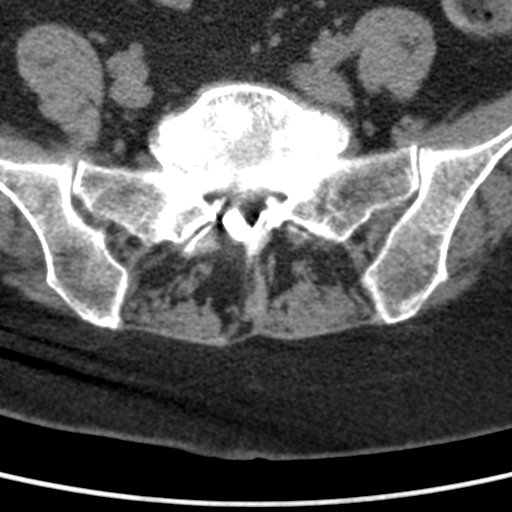
[im 13/76  bone]
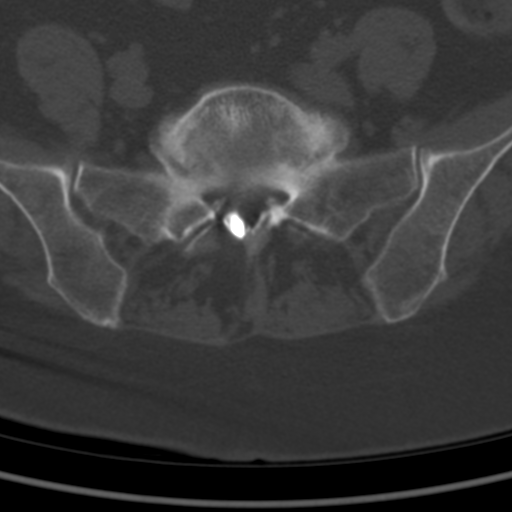
[im 26/76  bone]
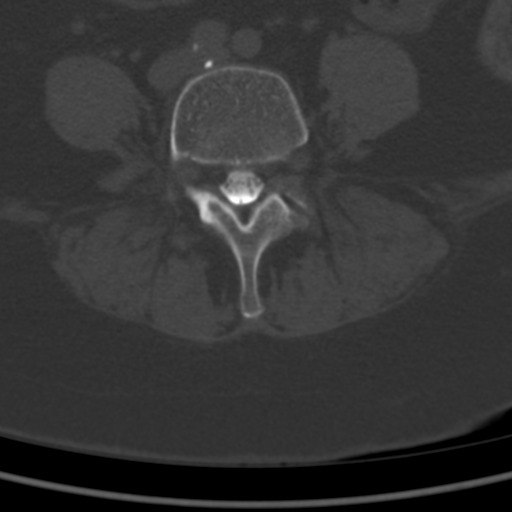
[im 38/76  bone]
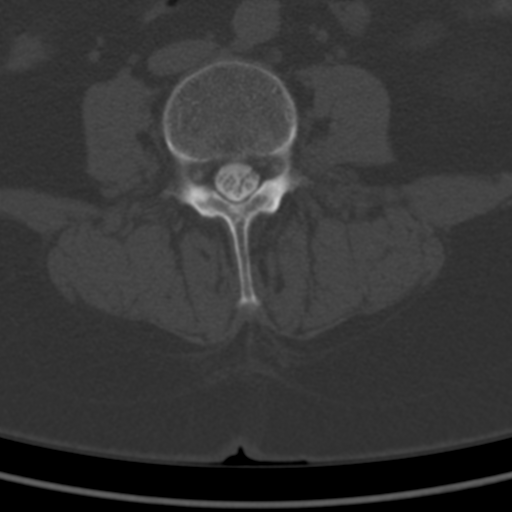
[im 51/76  bone]
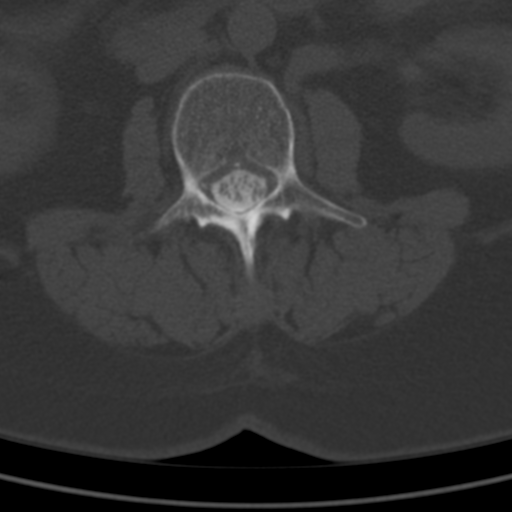
[im 63/76  soft-tissue]
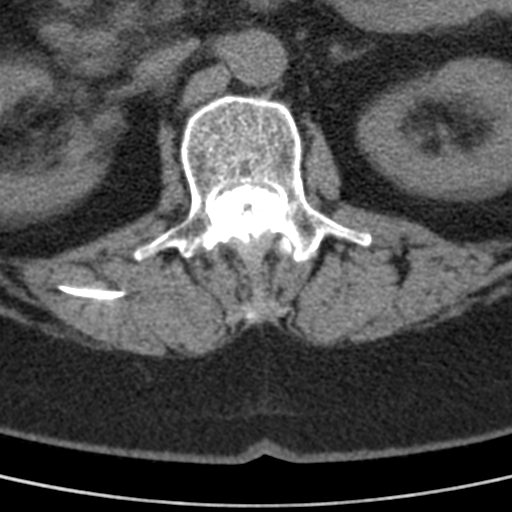
[im 63/76  bone]
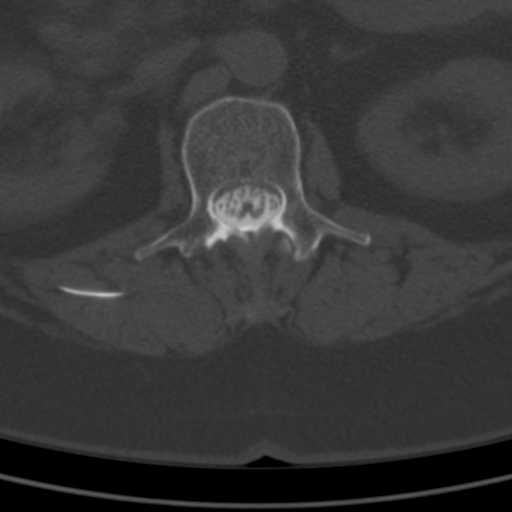

[5 of 14 positions shown; findings below may reference images not displayed]

EXAM:
LUMBAR MYELOGRAM

FLUOROSCOPY TIME:  Radiation Exposure Index (as provided by the
fluoroscopic device): 269.78 microGray*m^2

Fluoroscopy Time (in minutes and seconds):  22 seconds

PROCEDURE:
After thorough discussion of risks and benefits of the procedure
including bleeding, infection, injury to nerves, blood vessels,
adjacent structures as well as headache and CSF leak, written and
oral informed consent was obtained. Consent was obtained by Dr.
Sory Lucky. Time out form was completed.

Patient was positioned prone on the fluoroscopy table. Local
anesthesia was provided with 1% lidocaine without epinephrine after
prepped and draped in the usual sterile fashion. Puncture was
performed at L3-4 using a 3 1/2 inch 22-gauge spinal needle via a
left interlaminar approach. Using a single pass through the dura,
the needle was placed within the thecal sac, with return of clear
CSF. 15 mL of Isovue W-WEE was injected into the thecal sac, with
normal opacification of the nerve roots and cauda equina consistent
with free flow within the subarachnoid space.

I personally performed the lumbar puncture and administered the
intrathecal contrast. I also personally supervised acquisition of
the myelogram images.
FINDINGS: LUMBAR MYELOGRAM FINDINGS:

There are 5 non rib-bearing lumbar type vertebrae. Lumbar vertebral
alignment is normal, and there is no evidence of dynamic instability
on upright flexion or extension radiographs. There is chronic
advanced disc space narrowing at L5-S1 with a widely patent thecal
sac at this level. A small ventral extradural defect is again seen
at L4-5 with similar appearance of mild thecal sac narrowing in the
AP dimension. Slight spinal canal narrowing at L3-4 is also
unchanged. No nerve root cut off is seen. A spinal cord stimulator
is partially visualized coursing into the thoracic spine.

CT LUMBAR MYELOGRAM FINDINGS:

Vertebral alignment is unchanged. There is minimal right convex
curvature of the lumbar spine, and there is at most trace
retrolisthesis of L4 on L5. No fracture or suspicious osseous lesion
is identified. A small L4 inferior endplate Schmorl's node is
unchanged. The conus medullaris terminates at L1. Mild left greater
than right SI joint degenerative changes again noted. There is mild
abdominal aortic atherosclerosis without aneurysm.

T12-L1: Negative.

L1-2: Negative.

L2-3: Slight facet hypertrophy without disc herniation or stenosis,
unchanged.

L3-4: Shallow left foraminal and extraforaminal disc protrusion and
mild facet hypertrophy without significant stenosis, unchanged.

L4-5: Mild disc bulging, shallow left paracentral disc protrusion,
and mild facet hypertrophy result in mild left lateral recess
stenosis which has mildly worsened. There is at most minimal right
lateral recess narrowing, and there is no significant neural
foraminal stenosis. Slight spinal stenosis is more notable on the
conventional myelographic images.

L5-S1: Prior right laminotomy again noted. There is severe disc
space narrowing and degenerative sclerotic endplate changes have
both mildly progressed. Vacuum disc phenomenon is again noted.
Circumferential disc bulging and a left paracentral disc protrusion
result in unchanged mild left lateral recess stenosis, with disc in
close proximity to the left S1 nerve roots but without clear neural
compression. Disc and osteophyte extend into the inferior aspect of
both neural foramina without significant foraminal stenosis.
IMPRESSION: 1. Slight progression of mild left lateral recess stenosis at L4-5.
2. Mildly progressive disc degeneration at L5-S1 with unchanged left
lateral recess stenosis.
3.  Aortic Atherosclerosis (NADJS-AE0.0).

## 2019-06-03 IMAGING — XA DG MYELOGRAPHY LUMBAR INJ LUMBOSACRAL
13 of 16 series · 13 of 16 positions shown · non-contrast
Comparison: Lumbar myelogram 09/27/2015

CLINICAL DATA: Lumbar post-laminectomy syndrome. Low back and
bilateral lower extremity pain.
TECHNIQUE: Contiguous axial images were obtained through the Lumbar spine after
the intrathecal infusion of infusion. Coronal and sagittal
reconstructions were obtained of the axial image sets.

[Series 1: w lumbar spine lat · 0.15mm/px · 1 of 1 slices shown]
[im 1/1]
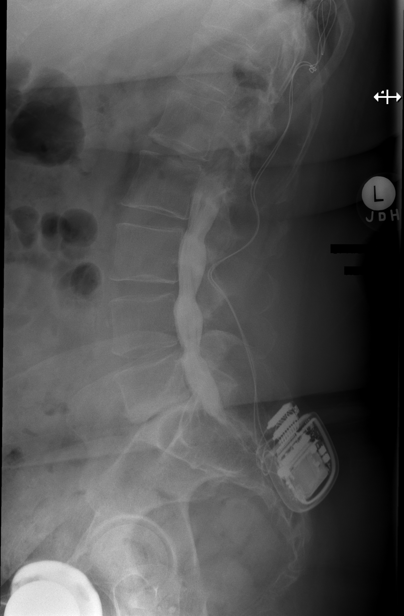

[Series 1: vasc adipose · 1 of 1 slices shown (1 of 10)]
[im 1/1]
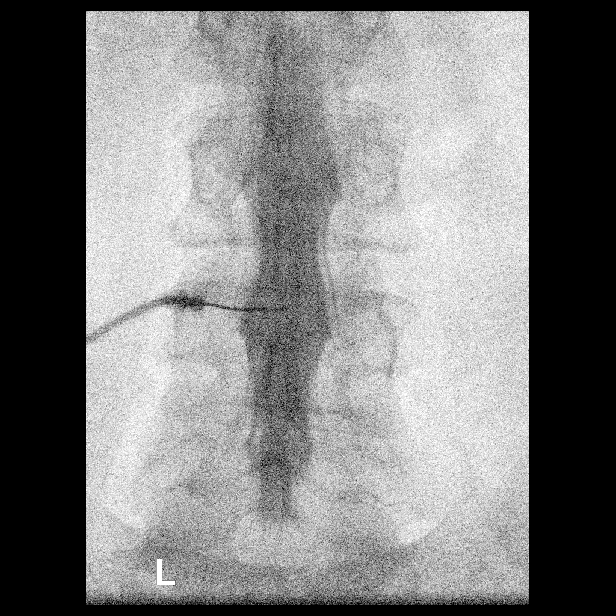

[Series 2: w lumbar spine flexion · 0.15mm/px · 1 of 1 slices shown]
[im 1/1]
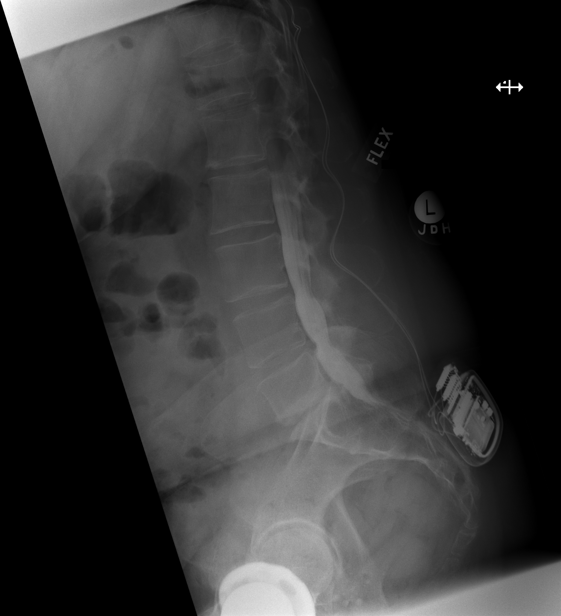

[Series 3: vasc adipose · 1 of 1 slices shown (2 of 10)]
[im 1/1]
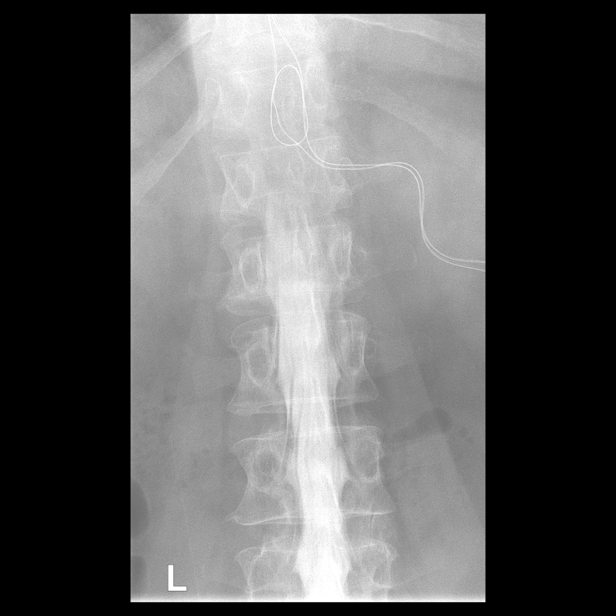

[Series 3: w lumbar spine extension · 0.15mm/px · 1 of 1 slices shown]
[im 1/1]
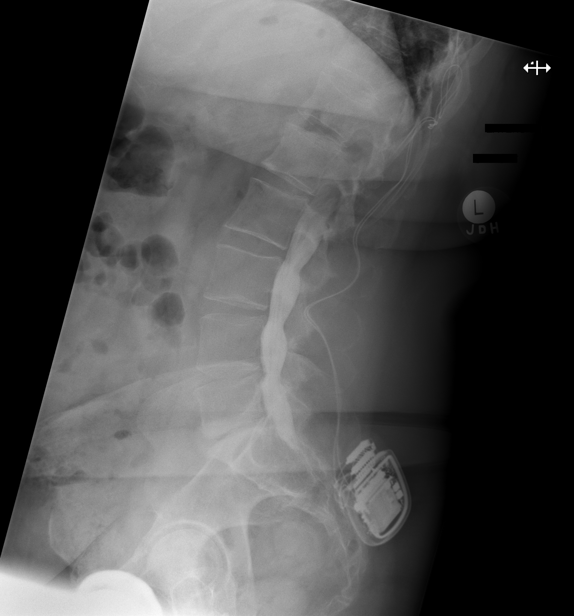

[Series 4: vasc adipose · 1 of 1 slices shown (3 of 10)]
[im 1/1]
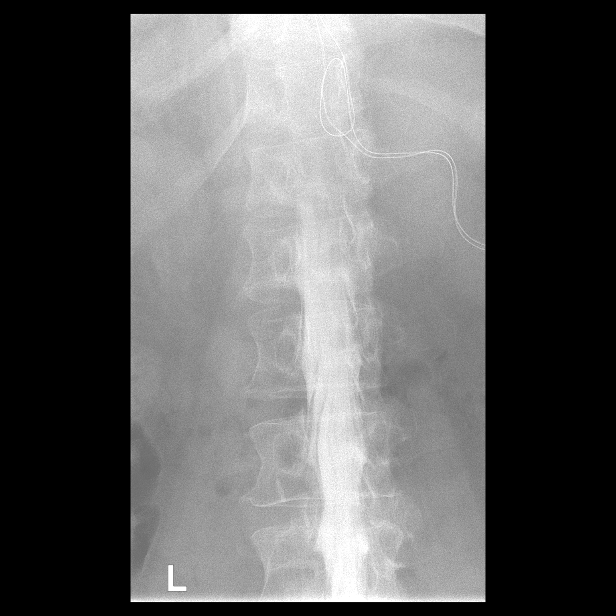

[Series 6: vasc adipose · 1 of 1 slices shown (4 of 10)]
[im 1/1]
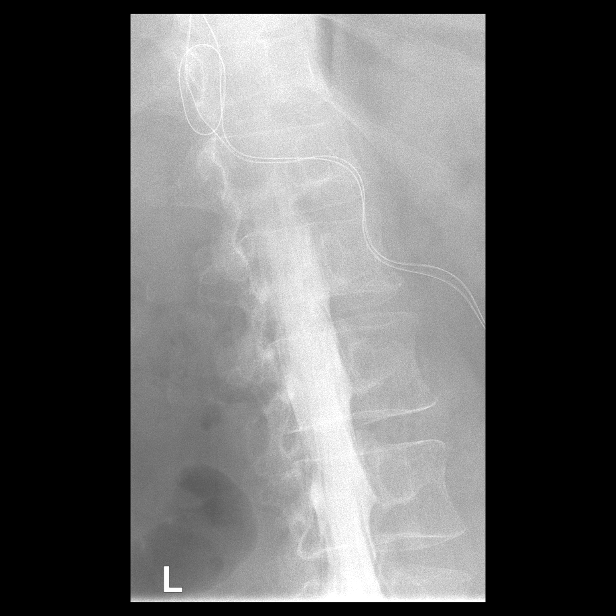

[Series 7: vasc adipose · 1 of 1 slices shown (5 of 10)]
[im 1/1]
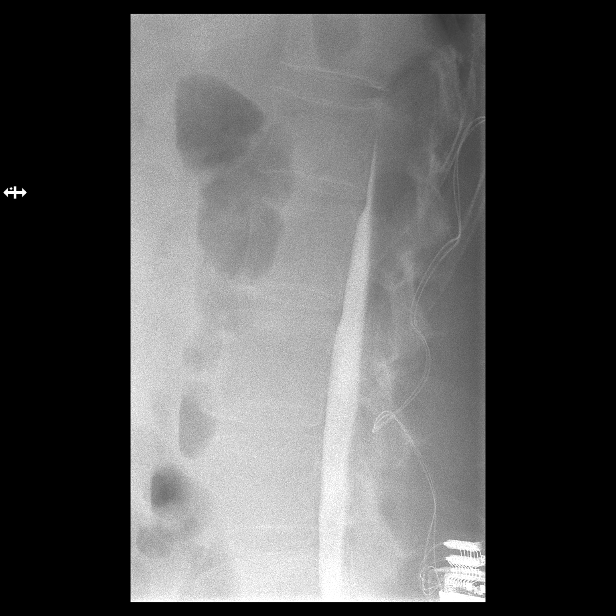

[Series 8: vasc adipose · 1 of 1 slices shown (6 of 10)]
[im 1/1]
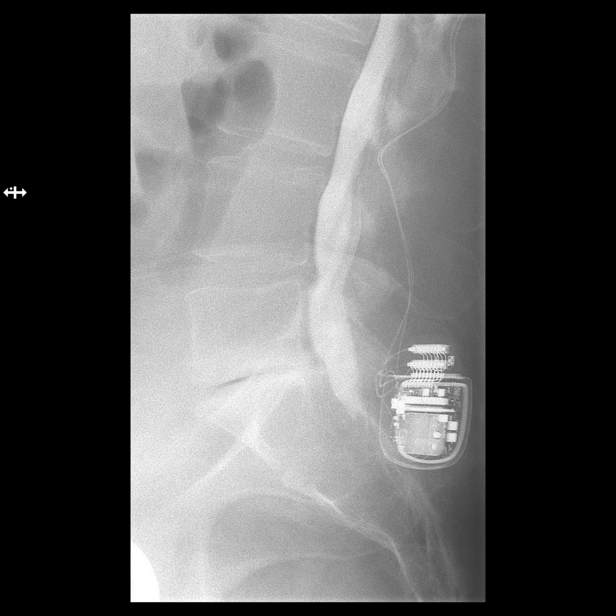

[Series 9: vasc adipose · 1 of 1 slices shown (7 of 10)]
[im 1/1]
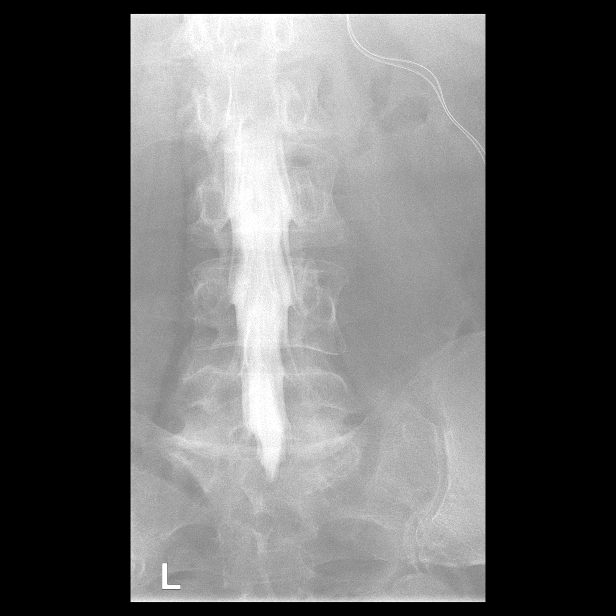

[Series 10: vasc adipose · 1 of 1 slices shown (8 of 10)]
[im 1/1]
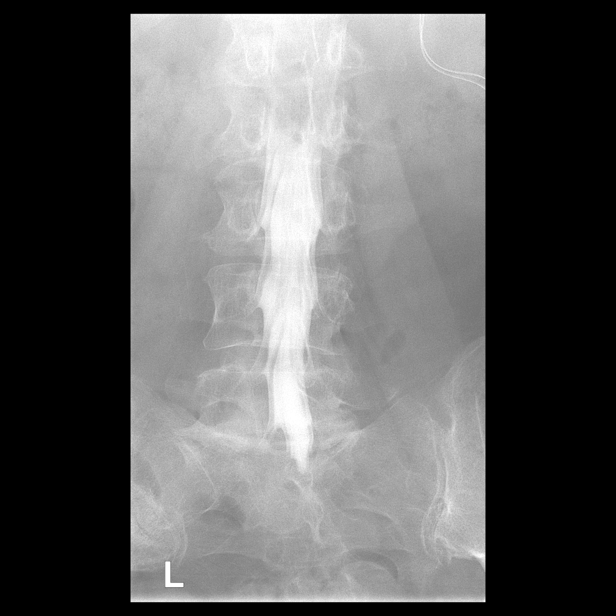

[Series 12: vasc adipose · 1 of 1 slices shown (9 of 10)]
[im 1/1]
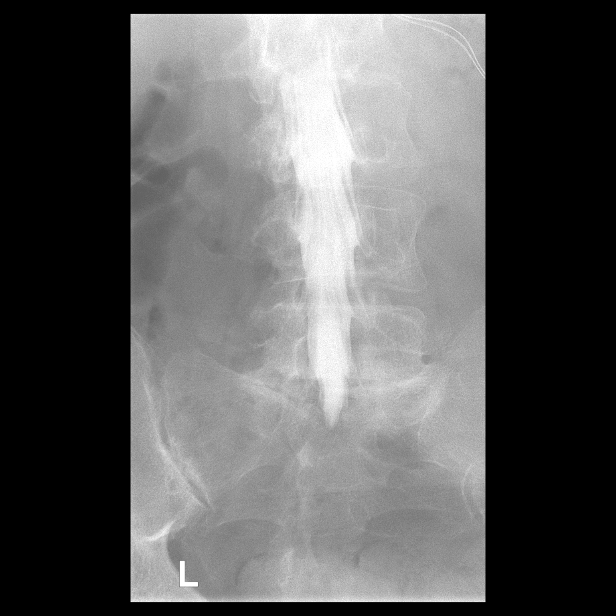

[Series 13: vasc adipose · 1 of 1 slices shown (10 of 10)]
[im 1/1]
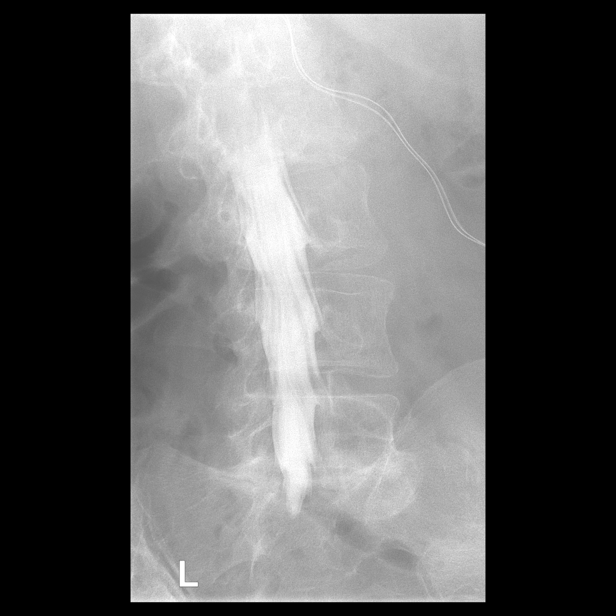

[13 of 16 positions shown; findings below may reference images not displayed]

EXAM:
LUMBAR MYELOGRAM

FLUOROSCOPY TIME:  Radiation Exposure Index (as provided by the
fluoroscopic device): 269.78 microGray*m^2

Fluoroscopy Time (in minutes and seconds):  22 seconds

PROCEDURE:
After thorough discussion of risks and benefits of the procedure
including bleeding, infection, injury to nerves, blood vessels,
adjacent structures as well as headache and CSF leak, written and
oral informed consent was obtained. Consent was obtained by Dr.
Sory Lucky. Time out form was completed.

Patient was positioned prone on the fluoroscopy table. Local
anesthesia was provided with 1% lidocaine without epinephrine after
prepped and draped in the usual sterile fashion. Puncture was
performed at L3-4 using a 3 1/2 inch 22-gauge spinal needle via a
left interlaminar approach. Using a single pass through the dura,
the needle was placed within the thecal sac, with return of clear
CSF. 15 mL of Isovue W-WEE was injected into the thecal sac, with
normal opacification of the nerve roots and cauda equina consistent
with free flow within the subarachnoid space.

I personally performed the lumbar puncture and administered the
intrathecal contrast. I also personally supervised acquisition of
the myelogram images.
FINDINGS: LUMBAR MYELOGRAM FINDINGS:

There are 5 non rib-bearing lumbar type vertebrae. Lumbar vertebral
alignment is normal, and there is no evidence of dynamic instability
on upright flexion or extension radiographs. There is chronic
advanced disc space narrowing at L5-S1 with a widely patent thecal
sac at this level. A small ventral extradural defect is again seen
at L4-5 with similar appearance of mild thecal sac narrowing in the
AP dimension. Slight spinal canal narrowing at L3-4 is also
unchanged. No nerve root cut off is seen. A spinal cord stimulator
is partially visualized coursing into the thoracic spine.

CT LUMBAR MYELOGRAM FINDINGS:

Vertebral alignment is unchanged. There is minimal right convex
curvature of the lumbar spine, and there is at most trace
retrolisthesis of L4 on L5. No fracture or suspicious osseous lesion
is identified. A small L4 inferior endplate Schmorl's node is
unchanged. The conus medullaris terminates at L1. Mild left greater
than right SI joint degenerative changes again noted. There is mild
abdominal aortic atherosclerosis without aneurysm.

T12-L1: Negative.

L1-2: Negative.

L2-3: Slight facet hypertrophy without disc herniation or stenosis,
unchanged.

L3-4: Shallow left foraminal and extraforaminal disc protrusion and
mild facet hypertrophy without significant stenosis, unchanged.

L4-5: Mild disc bulging, shallow left paracentral disc protrusion,
and mild facet hypertrophy result in mild left lateral recess
stenosis which has mildly worsened. There is at most minimal right
lateral recess narrowing, and there is no significant neural
foraminal stenosis. Slight spinal stenosis is more notable on the
conventional myelographic images.

L5-S1: Prior right laminotomy again noted. There is severe disc
space narrowing and degenerative sclerotic endplate changes have
both mildly progressed. Vacuum disc phenomenon is again noted.
Circumferential disc bulging and a left paracentral disc protrusion
result in unchanged mild left lateral recess stenosis, with disc in
close proximity to the left S1 nerve roots but without clear neural
compression. Disc and osteophyte extend into the inferior aspect of
both neural foramina without significant foraminal stenosis.
IMPRESSION: 1. Slight progression of mild left lateral recess stenosis at L4-5.
2. Mildly progressive disc degeneration at L5-S1 with unchanged left
lateral recess stenosis.
3.  Aortic Atherosclerosis (NADJS-AE0.0).

## 2020-04-19 ENCOUNTER — Other Ambulatory Visit: Payer: Self-pay | Admitting: Chiropractic Medicine

## 2020-04-19 DIAGNOSIS — M5416 Radiculopathy, lumbar region: Secondary | ICD-10-CM

## 2020-04-24 ENCOUNTER — Other Ambulatory Visit: Payer: Self-pay | Admitting: Chiropractic Medicine

## 2020-04-24 ENCOUNTER — Ambulatory Visit
Admission: RE | Admit: 2020-04-24 | Discharge: 2020-04-24 | Disposition: A | Discharge status: Home / Self Care | Payer: Medicare Other | Source: Ambulatory Visit | Attending: Chiropractic Medicine | Admitting: Chiropractic Medicine

## 2020-04-24 ENCOUNTER — Other Ambulatory Visit: Payer: Self-pay

## 2020-04-24 DIAGNOSIS — M5416 Radiculopathy, lumbar region: Secondary | ICD-10-CM

## 2020-04-24 MED ORDER — METHYLPREDNISOLONE ACETATE 40 MG/ML INJ SUSP (RADIOLOG
120.0000 mg | Freq: Once | INTRAMUSCULAR | Status: AC
  Administered 2020-04-24: 120 mg via EPIDURAL

## 2020-04-24 MED ORDER — IOPAMIDOL (ISOVUE-M 200) INJECTION 41%
1.0000 mL | Freq: Once | INTRAMUSCULAR | Status: AC
  Administered 2020-04-24: 1 mL via EPIDURAL

## 2020-04-24 NOTE — Discharge Instructions (Signed)
